# Patient Record
Sex: Male | Born: 1945 | Race: White | Hispanic: No | Marital: Married | Smoking: Never smoker
Health system: Southern US, Community
[De-identification: ages and names within clinical notes are randomized; demographics above are authoritative.]

## PROBLEM LIST (undated history)

## (undated) DIAGNOSIS — F418 Other specified anxiety disorders: Secondary | ICD-10-CM

## (undated) DIAGNOSIS — R0789 Other chest pain: Secondary | ICD-10-CM

## (undated) DIAGNOSIS — M47812 Spondylosis without myelopathy or radiculopathy, cervical region: Secondary | ICD-10-CM

## (undated) DIAGNOSIS — K5790 Diverticulosis of intestine, part unspecified, without perforation or abscess without bleeding: Secondary | ICD-10-CM

## (undated) DIAGNOSIS — Z5189 Encounter for other specified aftercare: Secondary | ICD-10-CM

## (undated) DIAGNOSIS — E669 Obesity, unspecified: Secondary | ICD-10-CM

## (undated) DIAGNOSIS — I2510 Atherosclerotic heart disease of native coronary artery without angina pectoris: Secondary | ICD-10-CM

## (undated) DIAGNOSIS — G4733 Obstructive sleep apnea (adult) (pediatric): Secondary | ICD-10-CM

## (undated) DIAGNOSIS — E049 Nontoxic goiter, unspecified: Secondary | ICD-10-CM

## (undated) DIAGNOSIS — K449 Diaphragmatic hernia without obstruction or gangrene: Secondary | ICD-10-CM

## (undated) DIAGNOSIS — C801 Malignant (primary) neoplasm, unspecified: Secondary | ICD-10-CM

## (undated) DIAGNOSIS — C4491 Basal cell carcinoma of skin, unspecified: Secondary | ICD-10-CM

## (undated) DIAGNOSIS — I712 Thoracic aortic aneurysm, without rupture, unspecified: Secondary | ICD-10-CM

## (undated) DIAGNOSIS — E785 Hyperlipidemia, unspecified: Secondary | ICD-10-CM

## (undated) DIAGNOSIS — J45909 Unspecified asthma, uncomplicated: Secondary | ICD-10-CM

## (undated) DIAGNOSIS — D126 Benign neoplasm of colon, unspecified: Secondary | ICD-10-CM

## (undated) DIAGNOSIS — G4730 Sleep apnea, unspecified: Secondary | ICD-10-CM

## (undated) DIAGNOSIS — I719 Aortic aneurysm of unspecified site, without rupture: Secondary | ICD-10-CM

## (undated) DIAGNOSIS — K589 Irritable bowel syndrome without diarrhea: Secondary | ICD-10-CM

## (undated) DIAGNOSIS — K760 Fatty (change of) liver, not elsewhere classified: Secondary | ICD-10-CM

## (undated) DIAGNOSIS — D696 Thrombocytopenia, unspecified: Secondary | ICD-10-CM

## (undated) DIAGNOSIS — I10 Essential (primary) hypertension: Secondary | ICD-10-CM

## (undated) HISTORY — DX: Thoracic aortic aneurysm, without rupture: I71.2

## (undated) HISTORY — DX: Obesity, unspecified: E66.9

## (undated) HISTORY — DX: Sleep apnea, unspecified: G47.30

## (undated) HISTORY — DX: Benign neoplasm of colon, unspecified: D12.6

## (undated) HISTORY — DX: Unspecified asthma, uncomplicated: J45.909

## (undated) HISTORY — DX: Basal cell carcinoma of skin, unspecified: C44.91

## (undated) HISTORY — PX: EXCISIONAL HEMORRHOIDECTOMY: SHX1541

## (undated) HISTORY — DX: Thrombocytopenia, unspecified: D69.6

## (undated) HISTORY — DX: Diverticulosis of intestine, part unspecified, without perforation or abscess without bleeding: K57.90

## (undated) HISTORY — DX: Spondylosis without myelopathy or radiculopathy, cervical region: M47.812

## (undated) HISTORY — DX: Hyperlipidemia, unspecified: E78.5

## (undated) HISTORY — DX: Nontoxic goiter, unspecified: E04.9

## (undated) HISTORY — PX: THYROIDECTOMY, PARTIAL: SHX18

## (undated) HISTORY — DX: Aortic aneurysm of unspecified site, without rupture: I71.9

## (undated) HISTORY — DX: Atherosclerotic heart disease of native coronary artery without angina pectoris: I25.10

## (undated) HISTORY — DX: Thoracic aortic aneurysm, without rupture, unspecified: I71.20

## (undated) HISTORY — PX: WISDOM TOOTH EXTRACTION: SHX21

## (undated) HISTORY — DX: Diaphragmatic hernia without obstruction or gangrene: K44.9

## (undated) HISTORY — DX: Encounter for other specified aftercare: Z51.89

## (undated) HISTORY — PX: OTHER SURGICAL HISTORY: SHX169

## (undated) HISTORY — PX: COLONOSCOPY: SHX174

## (undated) HISTORY — DX: Malignant (primary) neoplasm, unspecified: C80.1

## (undated) HISTORY — DX: Irritable bowel syndrome, unspecified: K58.9

## (undated) HISTORY — DX: Obstructive sleep apnea (adult) (pediatric): G47.33

## (undated) HISTORY — DX: Essential (primary) hypertension: I10

## (undated) HISTORY — DX: Other specified anxiety disorders: F41.8

## (undated) HISTORY — DX: Other chest pain: R07.89

## (undated) HISTORY — DX: Fatty (change of) liver, not elsewhere classified: K76.0

## (undated) HISTORY — PX: POLYPECTOMY: SHX149

---

## 1997-11-26 ENCOUNTER — Ambulatory Visit (HOSPITAL_COMMUNITY): Admission: RE | Admit: 1997-11-26 | Discharge: 1997-11-26 | Payer: Self-pay | Admitting: Gastroenterology

## 2003-04-29 ENCOUNTER — Emergency Department (HOSPITAL_COMMUNITY): Admission: EM | Admit: 2003-04-29 | Discharge: 2003-04-29 | Payer: Self-pay | Admitting: Family Medicine

## 2003-06-29 ENCOUNTER — Emergency Department (HOSPITAL_COMMUNITY): Admission: EM | Admit: 2003-06-29 | Discharge: 2003-06-29 | Payer: Self-pay | Admitting: Family Medicine

## 2004-05-28 ENCOUNTER — Emergency Department (HOSPITAL_COMMUNITY): Admission: EM | Admit: 2004-05-28 | Discharge: 2004-05-28 | Payer: Self-pay | Admitting: Emergency Medicine

## 2005-03-20 DIAGNOSIS — D126 Benign neoplasm of colon, unspecified: Secondary | ICD-10-CM

## 2005-03-20 HISTORY — DX: Benign neoplasm of colon, unspecified: D12.6

## 2005-03-23 ENCOUNTER — Ambulatory Visit: Payer: Self-pay | Admitting: Gastroenterology

## 2005-04-06 ENCOUNTER — Encounter (INDEPENDENT_AMBULATORY_CARE_PROVIDER_SITE_OTHER): Payer: Self-pay | Admitting: Specialist

## 2005-04-06 ENCOUNTER — Ambulatory Visit: Payer: Self-pay | Admitting: Gastroenterology

## 2006-05-22 ENCOUNTER — Ambulatory Visit (HOSPITAL_COMMUNITY): Admission: RE | Admit: 2006-05-22 | Discharge: 2006-05-22 | Payer: Self-pay | Admitting: *Deleted

## 2006-06-09 ENCOUNTER — Emergency Department (HOSPITAL_COMMUNITY): Admission: EM | Admit: 2006-06-09 | Discharge: 2006-06-09 | Payer: Self-pay | Admitting: Family Medicine

## 2007-03-17 ENCOUNTER — Ambulatory Visit: Payer: Self-pay | Admitting: *Deleted

## 2007-03-19 ENCOUNTER — Inpatient Hospital Stay (HOSPITAL_COMMUNITY): Admission: EM | Admit: 2007-03-19 | Discharge: 2007-03-20 | Payer: Self-pay | Admitting: Emergency Medicine

## 2007-03-26 ENCOUNTER — Encounter: Admission: RE | Admit: 2007-03-26 | Discharge: 2007-03-26 | Payer: Self-pay | Admitting: Cardiology

## 2008-11-13 IMAGING — CT CT ANGIO CHEST
3 of 5 series · 19 of 36 positions shown · IV contrast ([ID] OMNI 300)
Comparison: [HOSPITAL] portable chest x-ray, 03/17/07 at 7792 hours; [HOSPITAL] chest x-ray, 05/28/04.

CLINICAL DATA: Chest pain.
CT ANGIOGRAPHY OF CHEST WITHOUT AND WITH CONTRAST:
TECHNIQUE: Multidetector CT imaging of the chest was performed before and during bolus injection of intravenous contrast.  Multiplanar CT angiographic image reconstructions were generated to evaluate the vascular anatomy.
Contrast:  100 cc of Omnipaque 300.

[Series 5: angio · axial · 0.66mm/px · z∈[-330,-30]mm · 14 of 140 slices shown]
[im 10/140  lung]
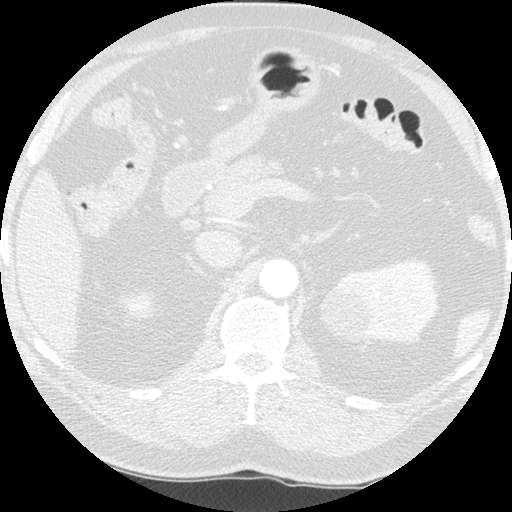
[im 19/140  mediastinal]
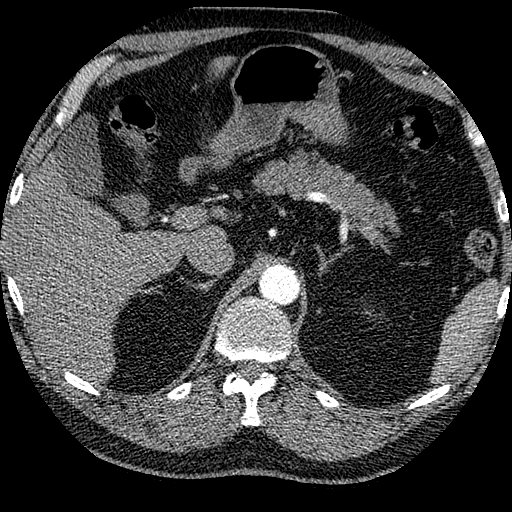
[im 28/140  lung]
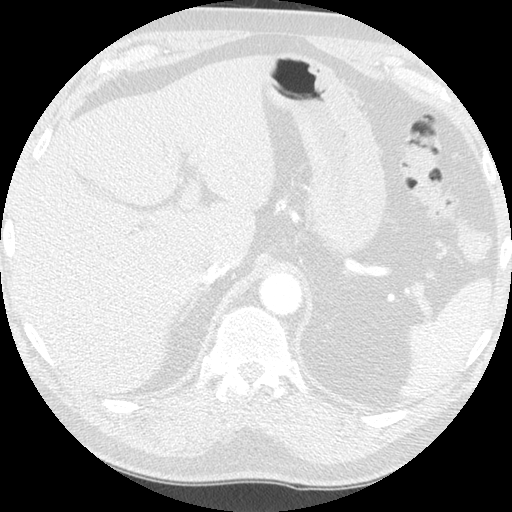
[im 38/140  mediastinal]
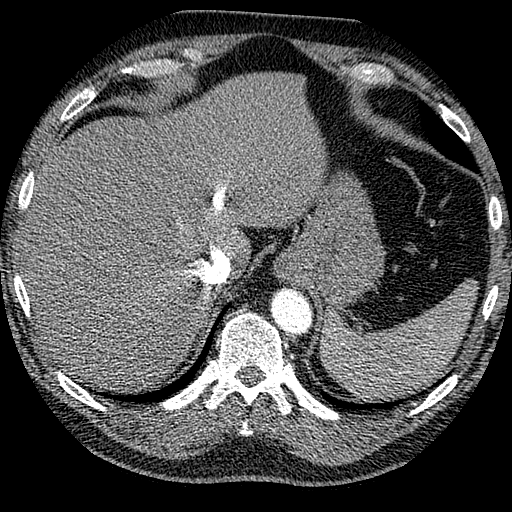
[im 47/140  lung]
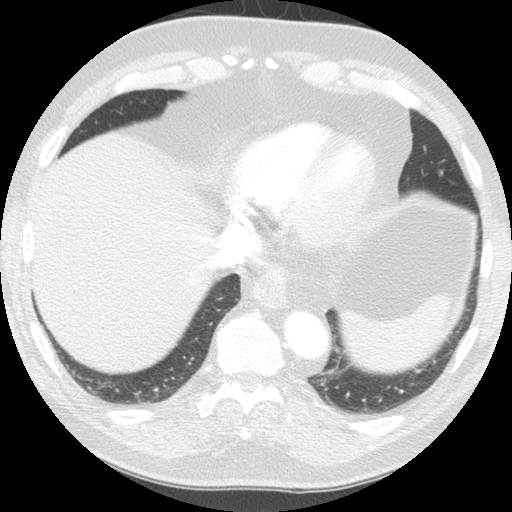
[im 56/140  mediastinal]
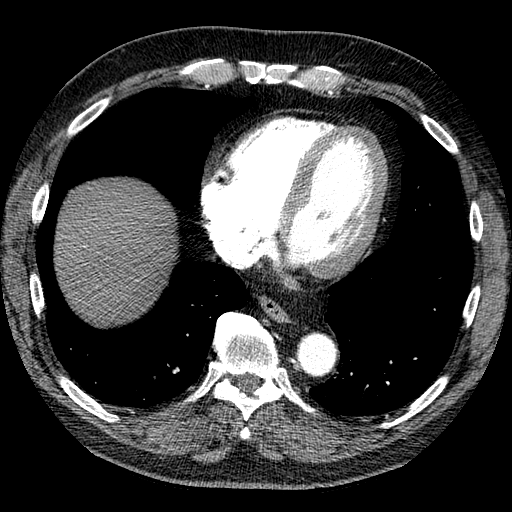
[im 65/140  lung]
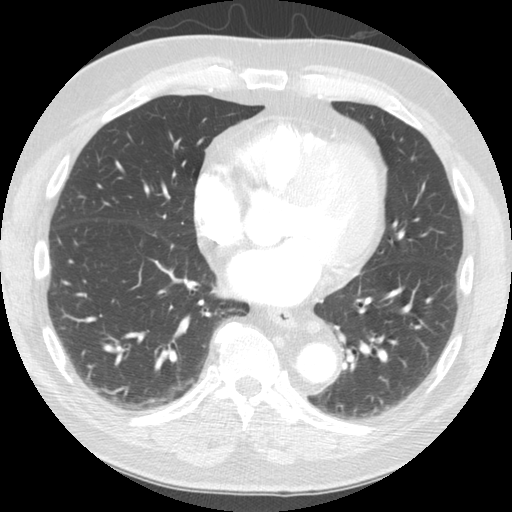
[im 75/140  mediastinal]
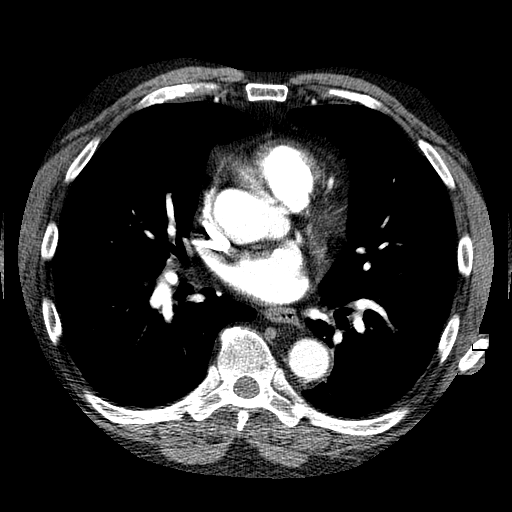
[im 84/140  lung]
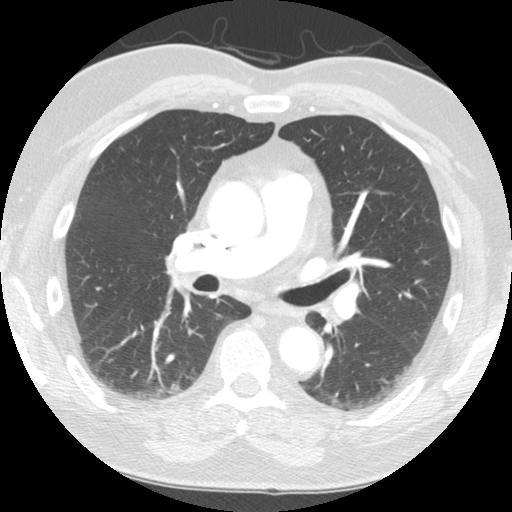
[im 93/140  mediastinal]
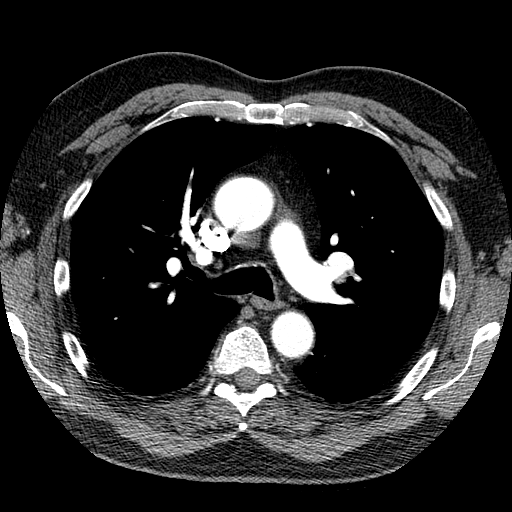
[im 102/140  lung]
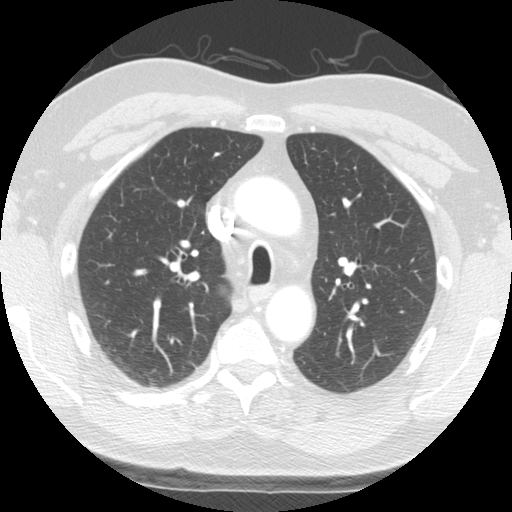
[im 112/140  mediastinal]
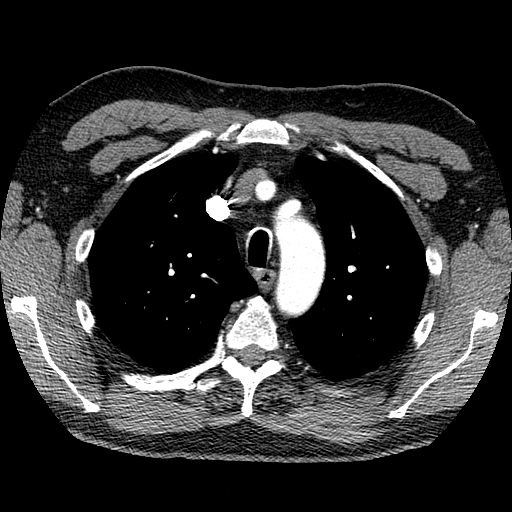
[im 121/140  lung]
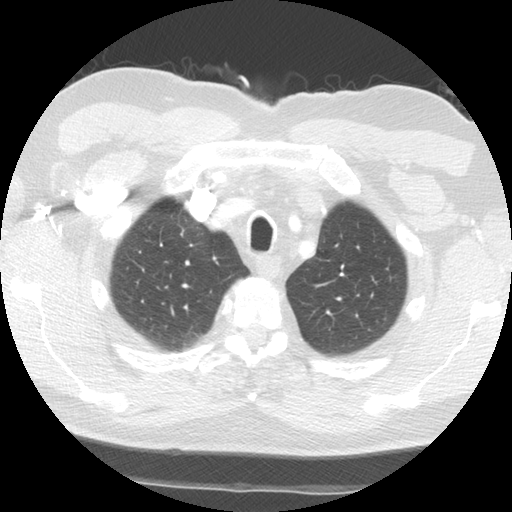
[im 130/140  mediastinal]
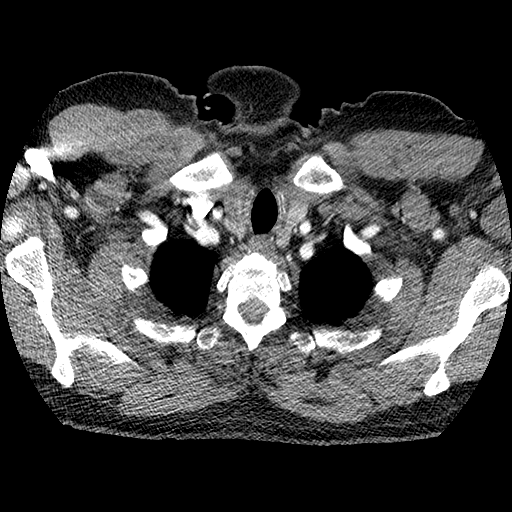

[Series 6: lung windows · axial · 0.62mm/px · z∈[-240,-195]mm · 2 of 57 slices shown]
[im 10/57  mediastinal]
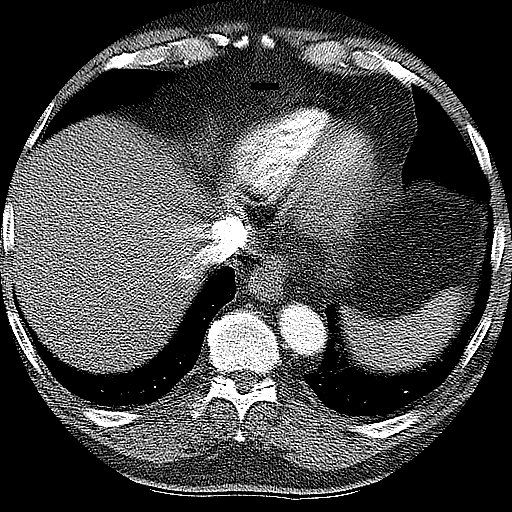
[im 19/57  mediastinal]
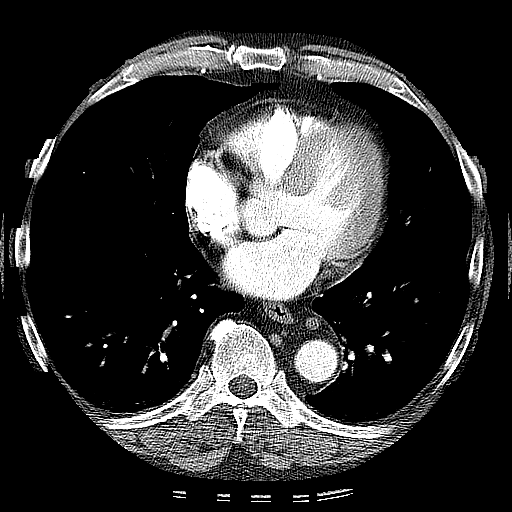

[Series 602: sagittal angio · sagittal · 0.68mm/px · 3 of 137 slices shown]
[im 28/137  mediastinal]
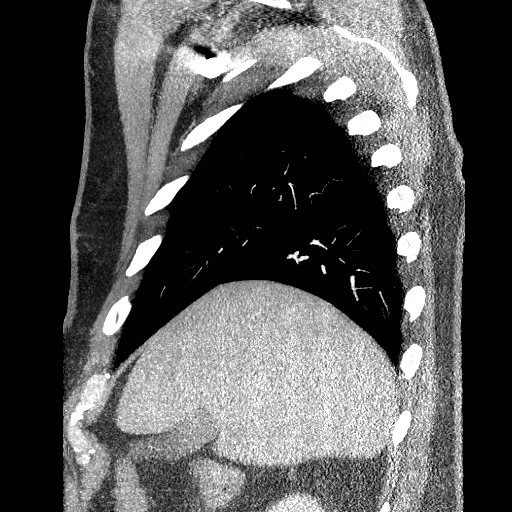
[im 55/137  mediastinal]
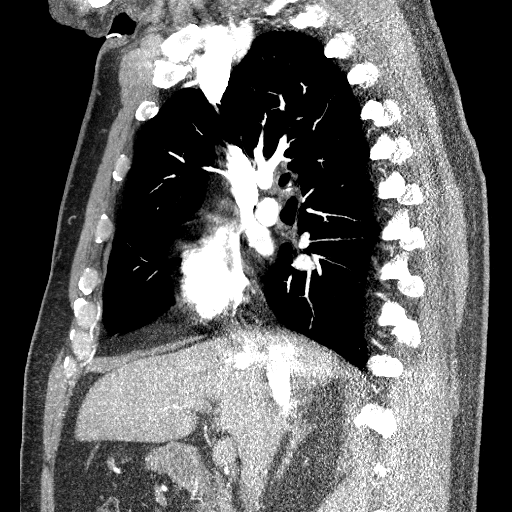
[im 82/137  mediastinal]
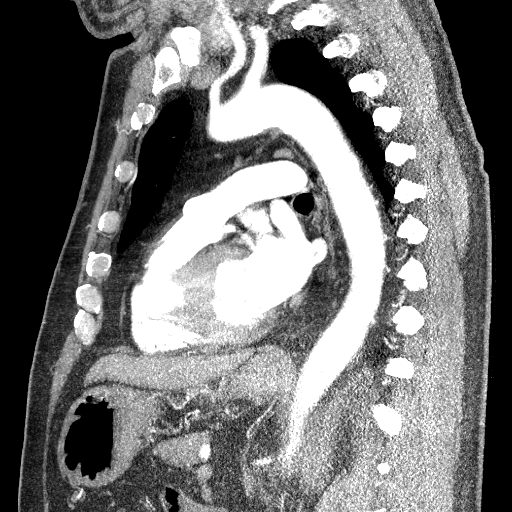

[19 of 36 positions shown; findings below may reference images not displayed]

FINDINGS: No acute thoracic aortic dissection nor thrombosis is seen with patent great arteries.  Ascending thoracic aorta measures up to 3.6 cm wide (axial image 58) with anterior arch 3.4 cm wide, posterior arch 3.2 cm wide, and descending thoracic aorta measuring 3.1 cm, all upper limits of normal.  No pulmonary embolus findings seen.  No mediastinal, hilar, nor axillary mass/adenopathy noted.  Heart size is normal yet prominent left ventricular hypertrophy is seen with wall measuring up to 1.4 cm wide (coronal image 59).  Lungs are clear.  No pericardial nor pleural effusion seen.  Slight diffuse fatty infiltration of the liver and 4 cm partially exophytic medial upper pole left renal cysts are seen.  Upper abdominal organs are otherwise normal appearing.  Degenerative changes are seen in the dorsal spine with calcified disk annulus seen at T12-L1 level.  No significant associated neural impingement is seen.
IMPRESSION: 1.  Thoracic aorta upper limits of normal in size with no dissection nor thrombosis.
2.  Left ventricular hypertrophy with wall measuring up to 1.4 cm in thickness. 
3.  No evidence for pulmonary embolus.
4.  Slight diffuse fatty infiltration of the liver, 4 cm upper pole left renal cyst, and degenerative changes of the lumbar spine, as described. 
5.  No acute findings.

## 2010-03-30 ENCOUNTER — Encounter: Payer: Self-pay | Admitting: Gastroenterology

## 2010-04-01 ENCOUNTER — Encounter (INDEPENDENT_AMBULATORY_CARE_PROVIDER_SITE_OTHER): Payer: Self-pay | Admitting: *Deleted

## 2010-04-21 ENCOUNTER — Encounter (INDEPENDENT_AMBULATORY_CARE_PROVIDER_SITE_OTHER): Payer: Self-pay | Admitting: *Deleted

## 2010-04-21 NOTE — Letter (Signed)
Summary: Colonoscopy Letter  [facility]  [address] [place], [place] [address]   Phone: [phone]  Fax: [phone]      March 30, 2010 MRN: [id number]   FARAH BENISH [address] [place], [place]  [address]   Dear Mr. DOWNARD,   According to your medical record, it is time for you to schedule a Colonoscopy. The American Cancer Society recommends this procedure as a method to detect early colon cancer. Patients with a family history of colon cancer, or a personal history of colon polyps or inflammatory bowel disease are at increased risk.  This letter has been generated based on the recommendations made at the time of your procedure. If you feel that in your particular situation this may no longer apply, please contact our office.  Please call our office at [phone] to schedule this appointment or to update your records at your earliest convenience.  Thank you for cooperating with [place] to provide you with the very best care possible.   Sincerely,  Judie Petit T. Russella Dar, M.D.  [facility] Gastroenterology Division [phone]

## 2010-04-21 NOTE — Letter (Signed)
Summary: Pre Visit Letter Revised  [facility]  [address] [place], [place] [address]   Phone: [phone]  Fax: [phone]        04/01/2010 MRN: [id number] Charles Underwood [address] [name], [place]  [address]                            Procedure Date:05/09/2010 @ 10:30                 Recall colon-Dr. Russella Dar Welcome to the Gastroenterology Division at [facility].    You are scheduled to see a nurse for your pre-procedure visit on 04/25/2010 at 2:30 on the 3rd floor at [facility], [address]  We ask that you try to arrive at our office 15 minutes prior to your appointment time to allow for check-in.  Please take a minute to review the attached form.  If you answer "Yes" to one or more of the questions on the first page, we ask that you call the person listed at your earliest opportunity.  If you answer "No" to all of the questions, please complete the rest of the form and bring it to your appointment.    Your nurse visit will consist of discussing your medical and surgical history, your immediate family medical history, and your medications.   If you are unable to list all of your medications on the form, please bring the medication bottles to your appointment and we will list them.  We will need to be aware of both prescribed and over the counter drugs.  We will need to know exact dosage information as well.    Please be prepared to read and sign documents such as consent forms, a financial agreement, and acknowledgement forms.  If necessary, and with your consent, a friend or relative is welcome to sit-in on the nurse visit with you.  Please bring your insurance card so that we may make a copy of it.  If your insurance requires a referral to see a specialist, please bring your referral form from your primary care physician.  No co-pay is required for this nurse visit.     If you cannot keep your appointment, please call [phone] to  cancel or reschedule prior to your appointment date.  This allows [place] the opportunity to schedule an appointment for another patient in need of care.    Thank you for choosing [facility] for your medical needs.  We appreciate the opportunity to care for you.  Please visit [place] at our website  to learn more about our practice.  Sincerely, The Gastroenterology Division

## 2010-05-02 ENCOUNTER — Encounter (INDEPENDENT_AMBULATORY_CARE_PROVIDER_SITE_OTHER): Payer: Self-pay | Admitting: *Deleted

## 2010-05-02 ENCOUNTER — Encounter: Payer: Self-pay | Admitting: Gastroenterology

## 2010-05-09 ENCOUNTER — Other Ambulatory Visit: Payer: Self-pay | Admitting: Gastroenterology

## 2010-05-09 ENCOUNTER — Other Ambulatory Visit (AMBULATORY_SURGERY_CENTER): Payer: Medicare Other | Admitting: Gastroenterology

## 2010-05-09 DIAGNOSIS — K5730 Diverticulosis of large intestine without perforation or abscess without bleeding: Secondary | ICD-10-CM

## 2010-05-09 DIAGNOSIS — Z86010 Personal history of colonic polyps: Secondary | ICD-10-CM

## 2010-05-09 DIAGNOSIS — Z1211 Encounter for screening for malignant neoplasm of colon: Secondary | ICD-10-CM

## 2010-05-09 DIAGNOSIS — D126 Benign neoplasm of colon, unspecified: Secondary | ICD-10-CM

## 2010-05-09 LAB — GLUCOSE, CAPILLARY: Glucose-Capillary: 128 mg/dL — ABNORMAL HIGH (ref 70–99)

## 2010-05-11 NOTE — Letter (Signed)
Summary: Moviprep Instructions  [facility]  [address]   [place], [place] [address]   Phone: [phone]  Fax: [phone]       Charles Underwood    1945/10/27    MRN: [id number]        Procedure Day Dorna Bloom: Monday, 05-09-10     Arrival Time: 9:30 a.m.     Procedure Time: 10:30 a.m.     Location of Procedure:                    x   [facility] (4th Floor)                        PREPARATION FOR COLONOSCOPY WITH MOVIPREP   Starting 5 days prior to your procedure 05-04-10 do not eat nuts, seeds, popcorn, corn, beans, peas,  salads, or any raw vegetables.  Do not take any fiber supplements (e.g. Metamucil, Citrucel, and Benefiber).  THE DAY BEFORE YOUR PROCEDURE         DATE: 05-08-10   DAY: Sunday  1.  Drink clear liquids the entire day-NO SOLID FOOD  2.  Do not drink anything colored red or purple.  Avoid juices with pulp.  No orange juice.  3.  Drink at least 64 oz. (8 glasses) of fluid/clear liquids during the day to prevent dehydration and help the prep work efficiently.  CLEAR LIQUIDS INCLUDE: Water Jello Ice Popsicles Tea (sugar ok, no milk/cream) Powdered fruit flavored drinks Coffee (sugar ok, no milk/cream) Gatorade Juice: apple, white grape, white cranberry  Lemonade Clear bullion, consomm, broth Carbonated beverages (any kind) Strained chicken noodle soup Hard Candy                             4.  In the morning, mix first dose of MoviPrep solution:    Empty 1 Pouch A and 1 Pouch B into the disposable container    Add lukewarm drinking water to the top line of the container. Mix to dissolve    Refrigerate (mixed solution should be used within 24 hrs)  5.  Begin drinking the prep at 5:00 p.m. The MoviPrep container is divided by 4 marks.   Every 15 minutes drink the solution down to the next mark (approximately 8 oz) until the full liter is complete.   6.  Follow completed prep with 16 oz of clear liquid of your choice  (Nothing red or purple).  Continue to drink clear liquids until bedtime.  7.  Before going to bed, mix second dose of MoviPrep solution:    Empty 1 Pouch A and 1 Pouch B into the disposable container    Add lukewarm drinking water to the top line of the container. Mix to dissolve    Refrigerate  THE DAY OF YOUR PROCEDURE      DATE: 05-09-10  DAY: Monday  Beginning at 5:30 a.m. (5 hours before procedure):         1. Every 15 minutes, drink the solution down to the next mark (approx 8 oz) until the full liter is complete.  2. Follow completed prep with 16 oz. of clear liquid of your choice.    3. You may drink clear liquids until 8:30 a.m.  (2 HOURS BEFORE PROCEDURE).   MEDICATION INSTRUCTIONS  Unless otherwise instructed, you should take regular prescription medications with a small sip of water   as early as possible  the morning of your procedure.  Diabetic patients - see separate instructions.   Additional medication instructions:  Hold Hydrochlorothiazide morning of procedure only!!!         OTHER INSTRUCTIONS  You will need a responsible adult at least 66 years of age to accompany you and drive you home.   This person must remain in the waiting room during your procedure.  Wear loose fitting clothing that is easily removed.  Leave jewelry and other valuables at home.  However, you may wish to bring a book to read or  an iPod/MP3 player to listen to music as you wait for your procedure to start.  Remove all body piercing jewelry and leave at home.  Total time from sign-in until discharge is approximately 2-3 hours.  You should go home directly after your procedure and rest.  You can resume normal activities the  day after your procedure.  The day of your procedure you should not:   Drive   Make legal decisions   Operate machinery   Drink alcohol   Return to work  You will receive specific instructions about eating, activities and medications before you  leave.    The above instructions have been reviewed and explained to me by   Sherren Kerns RN  May 02, 2010 2:21 PM     I fully understand and can verbalize these instructions _____________________________ Date _________

## 2010-05-11 NOTE — Letter (Signed)
Summary: Diabetic Instructions  [facility]  [address] [place], [place] [address]   Phone: [phone]  Fax: [phone]    Charles Underwood September 03, 1945 MRN: [id number]   _ x _   ORAL DIABETIC MEDICATION INSTRUCTIONS  The day before your procedure:   Take your diabetic pill as you do normally  The day of your procedure:   Do not take your diabetic pill    We will check your blood sugar levels during the admission process and again in Recovery before discharging you home  ________________________________________________________________________

## 2010-05-11 NOTE — Miscellaneous (Signed)
Summary: previsit prep/lec/rm  Clinical Lists Changes  Medications: Added new medication of MOVIPREP 100 GM  SOLR (PEG-KCL-NACL-NASULF-NA ASC-C) As per prep instructions. - Signed Rx of MOVIPREP 100 GM  SOLR (PEG-KCL-NACL-NASULF-NA ASC-C) As per prep instructions.;  #1 x 0;  Signed;  Entered by: Sherren Kerns RN;  Authorized by: Meryl Dare MD [facility];  Method used: [facility]  [id number] [address] [place], [place], [place]  [address], Ph: [phone], Fax: [phone] Observations: Added new observation of ALLERGY REV: Done (05/02/2010 14:25) Added new observation of NKA: T (05/02/2010 14:25)    Prescriptions: MOVIPREP 100 GM  SOLR (PEG-KCL-NACL-NASULF-NA ASC-C) As per prep instructions.  #1 x 0   Entered by:   Sherren Kerns RN   Authorized by:   Meryl Dare MD [facility]   Signed by:   Sherren Kerns RN on 05/02/2010   Method used:   [facility]        [facility]  [address] [id number] ([facility]ail)       [address]       [place], [place]  [address]       Ph: [phone]       Fax: [phone]   RxID:   [id number]

## 2010-05-12 ENCOUNTER — Encounter: Payer: Self-pay | Admitting: Gastroenterology

## 2010-05-17 NOTE — Procedures (Addendum)
Summary: Colonoscopy  Patient: Charles Underwood Note: All result statuses are Final unless otherwise noted.  Tests: (1) Colonoscopy (COL)   COL Colonoscopy           DONE (C)     [facility]     [address]     [place], [place]  [address]           COLONOSCOPY PROCEDURE REPORT           PATIENT:  [name], [name]  MR#:  [id number]     BIRTHDATE:  [birth year], 64 yrs. old  GENDER:  male     ENDOSCOPIST:  Judie Petit T. Russella Dar, MD, [facility]           PROCEDURE DATE:  05/09/2010     PROCEDURE:  Colonoscopy with snare polypectomy     ASA CLASS:  Class II     INDICATIONS:  1) surveillance and high-risk screening  2) history     of pre-cancerous (adenomatous) colon polyps: 03/2005.     MEDICATIONS:   Fentanyl 75 mcg IV, Versed 7 mg IV     DESCRIPTION OF PROCEDURE:   After the risks benefits and     alternatives of the procedure were thoroughly explained, informed     consent was obtained.  Digital rectal exam was performed and     revealed no abnormalities.   The LB [id number]-[id number] endoscope     was introduced through the anus and advanced to the cecum, which     was identified by both the appendix and ileocecal valve, without     limitations.  The quality of the prep was good, using MoviPrep.     The instrument was then slowly withdrawn as the colon was fully     examined.     <<PROCEDUREIMAGES>>     FINDINGS:  A sessile polyp was found in the distal transverse     colon. It was 5 mm in size. Polyp was snared without cautery.     Retrieval was successful. Moderate diverticulosis was found in the     sigmoid to descending colon.  Otherwise normal colonoscopy without     other polyps, masses, vascular ectasias, or inflammatory changes.     Retroflexed views in the rectum revealed internal hemorrhoids,     small.  The time to cecum =  3  minutes. The scope was then     withdrawn (time =  10.25  min) from the patient and the procedure     completed.           COMPLICATIONS:  None          ENDOSCOPIC IMPRESSION:     1) 5 mm sessile polyp in the distal transverse colon     2) Moderate diverticulosis in the sigmoid to descending colon     3) Internal hemorrhoids           RECOMMENDATIONS:     1) High fiber diet with liberal fluid intake.     2) Repeat Colonoscopy in 5 years pending pathology review.     3) Await pathology report           Judie Petit T. Russella Dar, MD, [facility]           n.     REVISED:  05/09/2010 11:20 AM     eSIGNED:   Venita Lick. Irena Gaydos at 05/09/2010 11:20 AM           Baker Janus, [id number]  Note: An exclamation  mark (!) indicates a result that was not dispersed into the flowsheet. Document Creation Date: 05/09/2010 11:21 AM _______________________________________________________________________  (1) Order result status: Final Collection or observation date-time: 05/09/2010 11:04 Requested date-time:  Receipt date-time:  Reported date-time:  Referring Physician:   Ordering Physician: Claudette Head [phone]) Specimen Source:  Source: Launa Grill Order Number: [id number] Lab site:   Appended Document: Colonoscopy     Procedures Next Due Date:    Colonoscopy: 05/2015

## 2010-05-17 NOTE — Letter (Addendum)
Summary: Patient Notice- Polyp Results  [facility]  [address] [place], [place] [address]   Phone: [phone]  Fax: [phone]        May 12, 2010 MRN: [id number]    Charles Underwood [address] [place], [place]  [address]    Dear Mr. CUTTING,  I am pleased to inform you that the colon polyp(s) removed during your recent colonoscopy was (were) found to be benign (no cancer detected) upon pathologic examination.  I recommend you have a repeat colonoscopy examination in 5 years to look for recurrent polyps, as having colon polyps increases your risk for having recurrent polyps or even colon cancer in the future.  Should you develop new or worsening symptoms of abdominal pain, bowel habit changes or bleeding from the rectum or bowels, please schedule an evaluation with either your primary care physician or with me.  Continue treatment plan as outlined the day of your exam.  Please call us if you are having persistent problems or have questions about your condition that have not been fully answered at this time.  Sincerely,  Meryl Dare MD [facility]  This letter has been electronically signed by your physician.  Appended Document: Patient Notice- Polyp Results letter mailed  Appended Document: Patient Notice- Polyp Results letter mailed

## 2010-08-02 NOTE — Consult Note (Signed)
Charles Underwood, HAILE NO.:  [id number]   MEDICAL RECORD NO.:  [id number]          PATIENT TYPE:  OBV   LOCATION:  3728                         FACILITY:  [facility]   PHYSICIAN:  Cassell Clement, M.D. DATE OF BIRTH:  February 19, 1946   DATE OF CONSULTATION:  03/18/2007  DATE OF DISCHARGE:                                 CONSULTATION   Cardiology Consultation   REFERRING PHYSICIAN:  Manning Charity, MD   HISTORY:  This is a 65 year old married Caucasian gentleman admitted on  March 17, 2007 after having suffered severe chest pain while taking a  shower on Sunday morning.  The chest pain was associated with profound  weakness, and a sense of fullness in the chest.  He also had a sensation  that his heart was pounding.  He gives a history that over the past  month or so, he has had increasing exertional fatigue, and has had some  intermittent chest pain with occasional left arm radiation.  The patient  does have multiple risk factors for coronary disease including a low HDL  level of 25.   Positive family history with the father having had coronary disease, but  dying of a ruptured aortic aneurysm, and a personal history of diabetes  and essential hypertension.  He has been getting most to his recent  medical care down at the [facility] in [place], but has been to [facility] in the past as well.   Since admission his cardiac enzymes have been normal and is resting  electrocardiograms have shown no acute changes.  His 2-dimensional  echocardiogram report is in the chart showing a low normal ejection  fraction of approximately 50% with mild diffuse hypokinesis, and a  slightly dilated aortic root at 41 mm.   SOCIAL HISTORY:  Reveals that he is married.  He does not use alcohol or  tobacco.   OUTPATIENT MEDICATIONS AT THE TIME OF ADMISSION INCLUDE:  1. Aspirin 81 mg daily.  2. Hydrochlorothiazide 25 mg daily.  3. Losartan 100 mg daily.  4. Omeprazole  20 mg daily.  5. Pravastatin 2 mg b.i.d.  6. Zocor 20 mg at bedtime.  7. Trazodone 200 mg at bedtime.  8. Metformin 500 mg b.i.d.  9. __________ 150 mg each a.m.  10.Citalopram 20 mg each bedtime.   PAST MEDICAL HISTORY:  Positive for depression and diabetes mellitus,  post traumatic stress syndrome hiatal hernia, diverticulosis,  hypertension.   REVIEW OF SYSTEMS:  Otherwise noncontributory.   PHYSICAL EXAM:  VITAL SIGNS:  Blood pressure tonight is 120/80 taken by  me in the left arm, pulse is 54 and is regular in normal sinus rhythm.  His color is good.  SKIN:  Warm and dry.  NECK:  Carotids are normal.  Jugular venous pressure normal.  Thyroid  normal.  CHEST:  Clear.  HEART:  Reveals a quiet precordium with normal first and second heart  sounds.  No murmur, gallop, rub or click.  ABDOMEN:  Soft and nontender.  No hepatosplenomegaly.  EXTREMITIES:  Show good peripheral pulses.  No phlebitis or edema.  NEUROLOGIC:  Exam is physiologic.  SKIN AND SENSORY:  Normal.   INITIAL LABS:  Satisfactory with a BUN of 16 and a creatinine of 1.1.  Potassium is 3.4, and potassium is being repleted.  Chest x-ray results  are pending and the radiology reading system is presently down computer-  wise.   IMPRESSION:  The patient presents a worrisome combination of chest pain,  unexplained fatigue, multiple risk factors for coronary disease and a  slightly low ejection fraction by echo.  With these risk factors would  proceed with cardiac catheterization.  We will schedule this for  tomorrow morning by Dr. Deborah Chalk or colleagues.           ______________________________  Cassell Clement, M.D.     TB/MEDQ  D:  03/18/2007  T:  03/19/2007  Job:  [id number]   cc:   Manning Charity, MD

## 2010-08-02 NOTE — Discharge Summary (Signed)
NAMEMACE, WEINBERG NO.:  [id number]   MEDICAL RECORD NO.:  [id number]          PATIENT TYPE:  OBV   LOCATION:  3728                         FACILITY:  [facility]   PHYSICIAN:  Joaquin Courts, MD     DATE OF BIRTH:  [birth year]   DATE OF ADMISSION:  03/17/2007  DATE OF DISCHARGE:  03/20/2007                               DISCHARGE SUMMARY   ATTENDING PHYSICIAN:  Dr. Madaline Guthrie.   DISCHARGE DIAGNOSES:  1. Chest pain.  2. Mild coronary artery disease.  3. Diabetes mellitus, type 2.  4. Elevated ALT.  5. Mild fatty infiltration of the liver.  6. Hypertension.  7. Hyperlipidemia.  8. Mild thrombocytopenia.  9. Obstructive sleep apnea.  10.Posttraumatic stress disorder.  11.Left renal cyst.   DISCHARGE MEDICATIONS:  1. Aspirin 81 mg daily.  2. Citalopram-D 50 mg q.h.s.  3. HCTZ 25 mg daily.  4. Losartan 100 mg daily.  5. Omeprazole 20 mg daily.  6. Prazosin 4 mg b.i.d.  7. Simvastatin 20 mg q.h.s.  8. Trazodone 200 mg q.h.s.  9. Metformin 500 mg b.i.d. with the first dose on January 3rd.   DISPOSITION AND FOLLOWUP:  The patient is to have a CT angio of the  aorta done in a week after discharge and Dr. Yevonne Pax office will  give him a call with that appointment.  The patient is also to follow up  with his primary care physician in two weeks.  At that time, a CBC needs  to be drawn to evaluate for his thrombocytopenia, and LFTs need to be  done to evaluate for transaminitis.   PROCEDURES PERFORMED:  A cardiac catheterization was performed during  this hospitalization by Dr. Roger Shelter.  The patient had a normal  left ventricular function.  Some ectasia of the descending aorta with no  aortic insufficiency was noted, and the patient also had mild, diffuse,  coronary atherosclerosis.  The left main coronary artery was completely  normal.  The left anterior descending had some irregularities, all less  than 30%.  The left circumflex had minor  irregularities.  The right  coronary was dominant and only minor irregularities scattered  throughout.   CONSULTATIONS:  Cardiology was consulted secondary to patient's history  of chest pain and fatigue given his history of hypertension and  hyperlipidemia and diabetes.   ADMITTING HISTORY AND PHYSICAL:  Patient is a 65 year old gentleman  presenting with a one month history of increasing fatigue with  occasional left-sided chest pain and occasional left arm pain.  The  morning of admission, he developed substernal chest discomfort that was  described as pressure-like and associated with mild nausea.  At the time  of arrival to the emergency department, his pain had resolved.  He  denied any diaphoresis or shortness of breath, nausea, and vomiting.   ADMISSION VITAL SIGNS:  Temperature 98.3, blood pressure 137/82, pulse  67, respiratory rate 16, O2 SAT 95% on room air.   PHYSICAL EXAMINATION:  GENERAL:  The patient is alert and pleasant and  in no distress.  NECK EXAM:  No lymphadenopathy and no thyromegaly.  RESPIRATORY EXAM:  Clear to auscultation bilaterally, no crackles, with  a normal respiratory effort.  CARDIOVASCULAR EXAM:  Regular rate and rhythm, heart sounds were  distant, but no murmurs, rubs, or gallops.  ABDOMINAL EXAM:  Positive bowel sounds, soft, nontender, and  nondistended.  EXTREMITY EXAM:  No edema, 2+ dorsalis pedis pulses bilaterally.  SKIN EXAM:  No rashes, some small arterial hemangiomas were noted on his  back.  NEURO EXAM:  Cranial nerves II-XII were intact, no cerebellar signs.  PSYCH EXAM:  His mood and affect were appropriate.   ADMISSION LABORATORY DATA:  Sodium 137, potassium 3.6, chloride 103,  bicarb 25, BUN 16, creatinine 1.2, glucose 220.  BNP less than 30.  Troponin 0.11.  White blood cell count 4.0, absolute neutrophil count  2.2, hemoglobin 15.0, MCV 90, and platelets 123.  Liver enzymes:  Bili  0.8, alkaline-phosphatase 55, AST 38, ALT  73, total protein 6.5, albumin  3.6, and calcium 8.8.  His myoglobin was normal at 93.6 and CK-MB less  than 1.   DIAGNOSTIC IMAGING:  Chest x-ray:  No acute disease.  Abdominal  ultrasound:  1)  Mild fatty infiltration of the liver; 2)  Slightly  ectatic abdominal aorta without overt aneurysm; 3)  Stable left kidney  upper pole renal cyst.   HOSPITAL COURSE:  1. Chest pain:  The patient's chest pain did not recur during this      hospitalization.  Cardiology was consulted and a workup was done      and it was not felt that the patient's chest pain was cardiac in      etiology.  A TSH was checked and was 0.607 and within normal      limits, and a free T4 was 1.25, within normal limits.  Cardiac      enzymes were cycled and his first troponin was mildly elevated at      0.11, and all repeat cardiac enzymes were within normal limits.      Serial EKGs were done and were all within normal limits.  An echo      was done and the patient's EF was estimated to 50%.  However, on      the cardiac catheterization report, his EF was at 60%.  Some mild,      diffuse, left ventricular hypokinesis was noted on the echo.  The      aortic valve thickness was mildly increased, and there was mild      aortic root dilatation along with left atrium mildly dilated.  2. Mild coronary artery disease:  The patient underwent cardiac      catheterization and very mild coronary artery disease was found,      and it was felt like medical management would be appropriate in      this patient with blood pressure control and statin therapy, along      with aspirin.  3. Diabetes mellitus, type 2:  The patient's Metformin was      discontinued while inpatient and he was started on sliding scale      insulin and his blood sugars were in the 100 to 200 range during      this hospitalization.  The patient is to restart his Metformin      three days after his cardiac catheterization.  A hemoglobin A1c was      checked  during this hospitalization and was elevated at 8.0.  4. Transaminitis:  The patient's transaminases were noted to be  mildly      elevated during this hospitalization.  His Nefazodone was stopped      and his liver enzymes trended down.  The patient was started on a      statin and will need his liver enzymes followed up as an      outpatient.  It was felt that he could continue his statin therapy      as long as his liver enzymes were monitored.  5. Mild fatty infiltration of the liver:  The patient had an abdominal      ultrasound done that indicated some mild fatty liver disease.  6. Hypertension:  The patient's blood pressure medications were      changed and a beta blocker was added, and he tolerated Metoprolol      well during this hospitalization.  However, when he did not have      any significant coronary artery disease, the patient was restarted      on his home medications.  7. Hyperlipidemia:  The patient noted that he had been off his statin      as an outpatient.  A lipid panel was checked during this      hospitalization and his total cholesterol was 160 with an LDL at      106.  Given his diabetes, his goal LDL was less than 70 and he was      restarted on a statin.  8. Obstructive sleep apnea:  The patient was continued on CPAP      throughout this hospitalization.  9. PTSD:  The patient was continued on his Citalopram, and Ativan was      given p.r.n. for agitation.  He did very well and did not have any      troubles during this hospitalization.  His Nefazodone was      discontinued secondary to his elevated liver enzymes.   DISCHARGE VITAL SIGNS:  Temperature 97.9, blood pressure 121/82, pulse  55, respiratory rate 18, O2 SAT 96%.   DISCHARGE LABORATORY DATA:  Sodium 135, potassium 3.7, chloride 105,  bicarb 26, BUN 15, creatinine 1.0, glucose 130, calcium 8.4.  White  blood cell count 5.0, hemoglobin 14.3, MCV 91.3, platelet count 140.  His acute hepatitis panel  was all within normal limits.      Joaquin Courts, MD  Electronically Signed     VW/MEDQ  D:  03/20/2007  T:  03/20/2007  Job:  [id number]

## 2010-08-02 NOTE — Cardiovascular Report (Signed)
NAMETKAI, SERFASS              ACCOUNT NO.:  [id number]   MEDICAL RECORD NO.:  [id number]          PATIENT TYPE:  OBV   LOCATION:  3728                         FACILITY:  [facility]   PHYSICIAN:  Colleen Can. Deborah Chalk, M.D.DATE OF BIRTH:  January 20, 1946   DATE OF PROCEDURE:  03/19/2007  DATE OF DISCHARGE:                            CARDIAC CATHETERIZATION   HISTORY:  Mr. Tankard is a 65 year old male who presents with increasing  fatigue and occasional left-sided chest discomfort and left arm pain.  He described a pressure-like sensation when he was showering.  There are  some atypical features of the discomfort.  He does have a positive  family history and is referred now for evaluation.   PROCEDURE:  Left heart catheterization with selective coronary  angiography, left ventricular angiography.   TYPE AND SITE OF ENTRY:  Percutaneous right femoral artery with  AngioSeal.   CATHETERS:  The 6-French 5 curved Judkins left coronary catheters, 6-  French 4 curved Judkins right coronary catheter, 6-French pigtail  ventriculographic catheter.   CONTRAST MATERIAL:  Omnipaque.   MEDICATIONS GIVEN PRIOR TO PROCEDURE:  Valium 10 mg.   MEDICATIONS GIVEN DURING THE PROCEDURE:  Versed 2 mg IV.   COMMENT:  The patient tolerated the procedure well.   HEMODYNAMIC DATA:  The aortic pressure was 105/70, LV was 123/3-19.  There was no aortic valve gradient noted on pullback.   ANGIOGRAPHIC DATA:  1. Left main coronary artery is normal.  2. Left anterior descending was only a moderate size vessel that      extended to the apex, but the apex was supplied by large marginal      vessels.  There were irregularities of less than 30% scattered      throughout the left anterior descending but there was definite      irregularities in the left anterior descending itself.  3. Left circumflex.  The left circumflex continues as a large      bifurcating marginal vessel.  It has minor irregularities but is  essentially normal.  4. Right coronary artery.  The right coronary artery is a large      dominant vessel.  There are minor irregularities scattered.  It has      a large posterior descending and a very large posterior lateral      branch.  There are no obstructive lesions.   Left ventricular angiogram is performed in RAO position.  The overall  cardiac size and silhouette are normal.  The global ejection fraction is  estimated at 60%.  Regional wall motion is normal.   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  This ectasia is somewhat diffuse  throughout the thoracic aorta is not focal or localizing.   OVERALL IMPRESSION:  1. Normal left ventricular function.  2. 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  3. Mild diffuse coronary atherosclerosis.   DISCUSSION:  It is felt that Mr. Forero could be managed medically at  this point in time.  Overall, his prognosis should be excellent.  Colleen Can. Deborah Chalk, M.D.  Electronically Signed     SNT/MEDQ  D:  03/19/2007  T:  03/20/2007  Job:  [id number]   cc:   Cassell Clement, M.D.  Internal Med. Teaching Service B  [employer] [employer]  [employer]

## 2010-08-09 ENCOUNTER — Other Ambulatory Visit: Payer: Self-pay | Admitting: Dermatology

## 2010-12-23 LAB — DIFFERENTIAL
Basophils Absolute: 0
Basophils Absolute: 0
Basophils Relative: 0
Basophils Relative: 1
Eosinophils Absolute: 0.2
Eosinophils Absolute: 0.3
Eosinophils Relative: 5
Lymphocytes Relative: 33
Lymphs Abs: 1.8
Monocytes Absolute: 0.5
Monocytes Relative: 9
Neutro Abs: 2.9
Neutrophils Relative %: 53

## 2010-12-23 LAB — CBC
HCT: 42.3
HCT: 42.4
HCT: 42.6
HCT: 43.4
Hemoglobin: 14.4
Hemoglobin: 14.7
Hemoglobin: 14.8
MCHC: 33.8
MCHC: 33.9
MCHC: 34.1
MCHC: 34.5
MCV: 89.4
MCV: 90.3
MCV: 90.3
MCV: 91.3
Platelets: 123 — ABNORMAL LOW
Platelets: 128 — ABNORMAL LOW
Platelets: 131 — ABNORMAL LOW
Platelets: 140 — ABNORMAL LOW
RBC: 4.68
RBC: 4.77
RDW: 12.6
RDW: 12.6
RDW: 12.6
WBC: 3.9 — ABNORMAL LOW
WBC: 5.5
WBC: 5.6

## 2010-12-23 LAB — HEPATIC FUNCTION PANEL
ALT: 71 — ABNORMAL HIGH
AST: 39 — ABNORMAL HIGH
Albumin: 3.5

## 2010-12-23 LAB — COMPREHENSIVE METABOLIC PANEL
ALT: 58 — ABNORMAL HIGH
ALT: 73 — ABNORMAL HIGH
AST: 35
AST: 38 — ABNORMAL HIGH
Albumin: 3.3 — ABNORMAL LOW
Albumin: 3.6
Albumin: 3.6
Alkaline Phosphatase: 49
Alkaline Phosphatase: 55
BUN: 13
BUN: 15
BUN: 16
CO2: 26
CO2: 30
Calcium: 8.7
Calcium: 8.8
Chloride: 100
Chloride: 102
Creatinine, Ser: 1.06
Creatinine, Ser: 1.11
Creatinine, Ser: 1.16
GFR calc Af Amer: >60
GFR calc Af Amer: >60
GFR calc non Af Amer: >60
GFR calc non Af Amer: >60
Glucose, Bld: 121 — ABNORMAL HIGH
Glucose, Bld: 96
Potassium: 3.4 — ABNORMAL LOW
Potassium: 4.5
Sodium: 133 — ABNORMAL LOW
Sodium: 135
Total Bilirubin: 0.7
Total Bilirubin: 0.8
Total Bilirubin: 0.9
Total Protein: 5.8 — ABNORMAL LOW
Total Protein: 6.2
Total Protein: 6.5

## 2010-12-23 LAB — CK TOTAL AND CKMB (NOT AT ARMC)
CK, MB: 2.6
Relative Index: 1.8
Total CK: 142

## 2010-12-23 LAB — I-STAT 8, (EC8 V) (CONVERTED LAB)
BUN: 16
Bicarbonate: 25.2 — ABNORMAL HIGH
Chloride: 103
Glucose, Bld: 220 — ABNORMAL HIGH
Potassium: 3.6
pH, Ven: 7.423 — ABNORMAL HIGH

## 2010-12-23 LAB — CARDIAC PANEL(CRET KIN+CKTOT+MB+TROPI)
CK, MB: 2
Relative Index: 2
Total CK: 102
Troponin I: 0.03
Troponin I: 0.03

## 2010-12-23 LAB — BASIC METABOLIC PANEL
BUN: 15
CO2: 26
Chloride: 105
Creatinine, Ser: 1
Potassium: 3.7

## 2010-12-23 LAB — POCT I-STAT CREATININE: Operator id: 285491

## 2010-12-23 LAB — LIPID PANEL
LDL Cholesterol: 106 — ABNORMAL HIGH
VLDL: 29

## 2010-12-23 LAB — URINALYSIS, ROUTINE W REFLEX MICROSCOPIC
Bilirubin Urine: NEGATIVE
Glucose, UA: 100 — AB
Ketones, ur: NEGATIVE
Nitrite: NEGATIVE
Protein, ur: NEGATIVE
Specific Gravity, Urine: 1.022
pH: 6

## 2010-12-23 LAB — HEPATITIS PANEL, ACUTE
HCV Ab: NEGATIVE
Hep A IgM: NEGATIVE
Hep B C IgM: NEGATIVE
Hepatitis B Surface Ag: NEGATIVE

## 2010-12-23 LAB — POCT CARDIAC MARKERS
CKMB, poc: 1.2
CKMB, poc: 1.3
Myoglobin, poc: 49.9
Myoglobin, poc: 83.7
Operator id: 295021
Troponin i, poc: 0.11 — ABNORMAL HIGH
Troponin i, poc: <0.05
Troponin i, poc: <0.05

## 2010-12-23 LAB — PROTIME-INR: Prothrombin Time: 12.9

## 2010-12-23 LAB — TSH: TSH: 0.607

## 2010-12-23 LAB — T4, FREE: Free T4: 1.25

## 2010-12-23 LAB — APTT: aPTT: 27

## 2010-12-23 LAB — TROPONIN I: Troponin I: 0.03

## 2010-12-28 ENCOUNTER — Other Ambulatory Visit: Payer: Self-pay | Admitting: Family Medicine

## 2010-12-28 ENCOUNTER — Ambulatory Visit
Admission: RE | Admit: 2010-12-28 | Discharge: 2010-12-28 | Disposition: A | Discharge status: Home / Self Care | Payer: Medicare Other | Source: Ambulatory Visit | Attending: Family Medicine | Admitting: Family Medicine

## 2010-12-28 MED ORDER — IOHEXOL 350 MG/ML SOLN: 125.0000 mL | Freq: Once | INTRAVENOUS | Status: AC | PRN

## 2011-01-16 ENCOUNTER — Telehealth: Payer: Self-pay | Admitting: Gastroenterology

## 2011-01-16 NOTE — Telephone Encounter (Signed)
Patient wants to discuss Dr Russella Dar having an EGD.  He is having chest pain.  His primary care MD has prescribed dexilant and had him stop omeprazole.  Neither seem to help.  I have scheduled an office visit for him for 01/25/11 2:45

## 2011-01-18 ENCOUNTER — Institutional Professional Consult (permissible substitution): Payer: Medicare Other | Admitting: Cardiovascular Disease

## 2011-01-23 ENCOUNTER — Encounter: Payer: Self-pay | Admitting: *Deleted

## 2011-01-24 ENCOUNTER — Ambulatory Visit (INDEPENDENT_AMBULATORY_CARE_PROVIDER_SITE_OTHER): Payer: Medicare Other | Admitting: Cardiovascular Disease

## 2011-01-24 VITALS — BP 118/84 | HR 75 | Ht 75.0 in | Wt 247.4 lb

## 2011-01-24 DIAGNOSIS — I10 Essential (primary) hypertension: Secondary | ICD-10-CM

## 2011-01-24 DIAGNOSIS — I2510 Atherosclerotic heart disease of native coronary artery without angina pectoris: Secondary | ICD-10-CM

## 2011-01-24 NOTE — Patient Instructions (Signed)
Your physician recommends that you schedule a follow-up appointment in: 4 WEEKS  Your physician recommends that you continue on your current medications as directed. Please refer to the Current Medication list given to you today.  Your physician has requested that you have en exercise stress myoview. For further information please visit [email or web address] Please follow instruction sheet, as given.

## 2011-01-25 ENCOUNTER — Encounter: Payer: Self-pay | Admitting: Gastroenterology

## 2011-01-25 ENCOUNTER — Ambulatory Visit (INDEPENDENT_AMBULATORY_CARE_PROVIDER_SITE_OTHER): Payer: Medicare Other | Admitting: Gastroenterology

## 2011-01-25 DIAGNOSIS — Z860100 Personal history of colon polyps, unspecified: Secondary | ICD-10-CM | POA: Insufficient documentation

## 2011-01-25 DIAGNOSIS — R079 Chest pain, unspecified: Secondary | ICD-10-CM

## 2011-01-25 DIAGNOSIS — K219 Gastro-esophageal reflux disease without esophagitis: Secondary | ICD-10-CM | POA: Insufficient documentation

## 2011-01-25 NOTE — Progress Notes (Signed)
History of Present Illness: This is a 65 year old male here today with his wife. He relates a several week history of mid sternal chest pain radiating directly through to his back. This pain was exacerbated by movement. He did not have any typical reflux symptoms and the symptoms did not change with meals. The pain is gradually subsiding. He underwent a chest CT angiogram which was unremarkable and he was evaluated by Dr. Excell Seltzer. A Myoview test is planned. He relates one episode of a spasm-like rectal pain that occurred in the middle of night and then resolved spontaneously. He underwent colonoscopy in February 2012. Denies weight loss, abdominal pain, constipation, diarrhea, change in stool caliber, melena, hematochezia, nausea, vomiting, dysphagia, reflux symptoms.  Current Medications, Allergies, Past Medical History, Past Surgical History, Family History and Social History were reviewed in [employer] record.  Physical Exam: General: Well developed , well nourished, no acute distress Head: Normocephalic and atraumatic Eyes:  sclerae anicteric, EOMI Ears: Normal auditory acuity Mouth: No deformity or lesions Lungs: Clear throughout to auscultation Heart: Regular rate and rhythm; no murmurs, rubs or bruits Abdomen: Soft, non tender and non distended. No masses, hepatosplenomegaly or hernias noted. Normal Bowel sounds Musculoskeletal: Symmetrical with no gross deformities  Pulses:  Normal pulses noted Extremities: No clubbing, cyanosis, edema or deformities noted Neurological: Alert oriented x 4, grossly nonfocal Psychological:  Alert and cooperative. Normal mood and affect  Assessment and Recommendations:  1. Chest pain. This does not appear to be GERD or GI related. Further cardiac workup is pending. If his chest pain returns consider proceeding with upper endoscopy and abdominal ultrasound to further evaluate. He declines further GI evaluation at this time.  2. GERD.  Continue omeprazole 40 mg daily and standard antireflux measures.  3. Personal history of adenomatous colon polyps. Surveillance colonoscopy recommended February 2017.  4. Self-limited rectal pain. 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

## 2011-01-25 NOTE — Patient Instructions (Addendum)
Take your omeprazole two tablets by mouth once daily at the same time every day. Patient advised to avoid spicy, acidic, citrus, chocolate, mints, fruit and fruit juices.  Limit the intake of caffeine, alcohol and Soda.  Don't exercise too soon after eating.  Don't lie down within 3-4 hours of eating.  Elevate the head of your bed. cc: Leo Grosser, MD

## 2011-01-27 ENCOUNTER — Encounter: Payer: Self-pay | Admitting: Gastroenterology

## 2011-01-31 ENCOUNTER — Encounter: Payer: Self-pay | Admitting: Cardiovascular Disease

## 2011-01-31 DIAGNOSIS — I2510 Atherosclerotic heart disease of native coronary artery without angina pectoris: Secondary | ICD-10-CM | POA: Insufficient documentation

## 2011-01-31 DIAGNOSIS — I10 Essential (primary) hypertension: Secondary | ICD-10-CM | POA: Insufficient documentation

## 2011-01-31 NOTE — Assessment & Plan Note (Signed)
BP well-controlled on current medical therapy.

## 2011-01-31 NOTE — Assessment & Plan Note (Signed)
The patient has atypical angina. However, with diabetes, strong family hx CAD, and 3 vessel coronary calcification he clearly warrants risk stratification with an exercise Myoview stress test. If any ischemia I would recommend cardiac catheterization for definitive diagnosis. I will arrange follow-up after the patient's stress test is completed. He is on appropriate medical therapy with ASA and a statin drug. 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

## 2011-01-31 NOTE — Progress Notes (Signed)
HPI:  This is a 65 year-old gentleman presenting for initial evaluation of chest pain and CAD. He has a background risk profile that includes Type 2 diabetes, hypertension, hyperlipidemia, and family history of CAD (father had CABG at 91). The patient had a recent CT of the chest (noncardiac CT) showing 3 vessel coronary calcification. 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   The patient has had dull left-sided chest pain, fleeting in nature. He reports the pain also as 'sharp' at times and it has radiated through to the back. Generally symptoms last only a matter of seconds to a few minutes. He has not had exertional chest pain or pressure with activities such as lawn-mowing and yardwork. He complains of generalized fatigue, but has no other specific complaints. He denies dyspnea, edema, palpitations, lightheadedness, or syncope.  He sees Dr Tanya Nones for general medical care and also is followed closely at the [facility].  Outpatient Encounter Prescriptions as of 01/24/2011  Medication Sig Dispense Refill  . aspirin 81 MG tablet Take 81 mg by mouth daily.        . beta carotene w/minerals (OCUVITE) tablet Take 1 tablet by mouth daily.        . hydrochlorothiazide (HYDRODIURIL) 25 MG tablet Take 25 mg by mouth daily.        Marland Kitchen losartan (COZAAR) 100 MG tablet Take 100 mg by mouth daily.        . metFORMIN (GLUCOPHAGE) 850 MG tablet Take 850 mg by mouth 2 (two) times daily with a meal.        . NON FORMULARY Eye drops each eye four times a day as needed       . omeprazole (PRILOSEC) 20 MG capsule Take 20 mg by mouth daily. Two capsules daily       . pravastatin (PRAVACHOL) 80 MG tablet Take 80 mg by mouth daily.        . prazosin (MINIPRESS) 2 MG capsule Take 2 mg by mouth. Two capsules by mouth twice a day      . sodium chloride (OCEAN) 0.65 % nasal spray Place 1 spray into the nose as needed. Twice daily       . traZODone (DESYREL) 50 MG tablet Take 50 mg  by mouth at bedtime. 3 tablets daily       . DISCONTD: ascorbic acid (VITAMIN C) 1000 MG tablet Take 1,000 mg by mouth daily.        Marland Kitchen DISCONTD: cholecalciferol (VITAMIN D) 400 UNITS TABS Take 1,000 Units by mouth daily. 2 tablets       . DISCONTD: fish oil-omega-3 fatty acids 1000 MG capsule Take 2 g by mouth daily.        Marland Kitchen DISCONTD: flunisolide (NASALIDE) 0.025 % SOLN Inhale 2 sprays into the lungs 2 (two) times daily.         Review of patient's allergies indicates no known allergies.  Past Medical History  Diagnosis Date  . Atypical chest pain   . Obesity   . Atherosclerosis 12/28/2010    on CTA of chest   . Hiatal hernia   . IBS (irritable bowel syndrome)   . Diverticulosis   . Hypertension   . Hemorrhoids   . Adenomatous polyp of colon 03/2005  . Hyperlipidemia   . Fatty infiltration of liver   . Thrombocytopenia   . OSA (obstructive sleep apnea)   . Diabetes mellitus   . Depression with anxiety   .  CAD (coronary artery disease)     No past surgical history on file.  History   Social History  . Marital Status: Married    Spouse Name: N/A    Number of Children: 4  . Years of Education: N/A   Occupational History  . Retired     Hotel manager    Social History Main Topics  . Smoking status: Never Smoker   . Smokeless tobacco: Never Used  . Alcohol Use: No  . Drug Use: No  . Sexually Active: Not on file   Other Topics Concern  . Not on file   Social History Narrative   0 caffeine drinks daily     Family History  Problem Relation Age of Onset  . Colon cancer Maternal Grandfather   . Colon polyps Maternal Grandfather     ROS: General: no fevers/chills/night sweats, positive for generalized fatigue Eyes: no blurry vision, diplopia, or amaurosis ENT: no sore throat or hearing loss, positive for seasonal allergies Resp: no cough, wheezing, or hemoptysis CV: no edema or palpitations GI: no abdominal pain, nausea, vomiting, diarrhea, or constipation,  positive for dyspepsia GU: no dysuria, frequency, or hematuria Skin: no rash Neuro: no headache, numbness, tingling, or weakness of extremities, positive for anxiety and depression Musculoskeletal: no joint pain or swelling Heme: no bleeding, DVT, or easy bruising Endo: no polydipsia or polyuria  BP 118/84  Pulse 75  Ht 6\' 3"  (1.905 m)  Wt 112.22 kg (247 lb 6.4 oz)  BMI 30.92 kg/m2  PHYSICAL EXAM: Pt is alert and oriented, WD, WN, in no distress. HEENT: normal Neck: JVP normal. Carotid upstrokes normal without bruits. No thyromegaly. Lungs: equal expansion, clear bilaterally CV: Apex is discrete and nondisplaced, RRR without murmur or gallop Abd: soft, NT, +BS, no bruit, no hepatosplenomegaly Back: no CVA tenderness Ext: no C/C/E        Femoral pulses 2+= without bruits        DP/PT pulses intact and = Skin: warm and dry without rash Neuro: CNII-XII intact             Strength intact = bilaterally  EKG:  NSR 75 bpm, LAFB, otherwise within normal limits  ASSESSMENT AND PLAN:

## 2011-02-01 ENCOUNTER — Ambulatory Visit (HOSPITAL_COMMUNITY): Payer: Medicare Other | Attending: Cardiovascular Disease | Admitting: Radiology

## 2011-02-01 VITALS — Ht 75.0 in | Wt 243.0 lb

## 2011-02-01 DIAGNOSIS — R5381 Other malaise: Secondary | ICD-10-CM | POA: Insufficient documentation

## 2011-02-01 DIAGNOSIS — I2510 Atherosclerotic heart disease of native coronary artery without angina pectoris: Secondary | ICD-10-CM

## 2011-02-01 DIAGNOSIS — I10 Essential (primary) hypertension: Secondary | ICD-10-CM | POA: Insufficient documentation

## 2011-02-01 DIAGNOSIS — R079 Chest pain, unspecified: Secondary | ICD-10-CM | POA: Insufficient documentation

## 2011-02-01 DIAGNOSIS — I498 Other specified cardiac arrhythmias: Secondary | ICD-10-CM

## 2011-02-01 DIAGNOSIS — Z8249 Family history of ischemic heart disease and other diseases of the circulatory system: Secondary | ICD-10-CM | POA: Insufficient documentation

## 2011-02-01 DIAGNOSIS — E785 Hyperlipidemia, unspecified: Secondary | ICD-10-CM | POA: Insufficient documentation

## 2011-02-01 DIAGNOSIS — E119 Type 2 diabetes mellitus without complications: Secondary | ICD-10-CM | POA: Insufficient documentation

## 2011-02-01 MED ORDER — TECHNETIUM TC 99M TETROFOSMIN IV KIT
33.0000 | PACK | Freq: Once | INTRAVENOUS | Status: AC | PRN
  Administered 2011-02-01: 33 via INTRAVENOUS

## 2011-02-01 MED ORDER — TECHNETIUM TC 99M TETROFOSMIN IV KIT
11.0000 | PACK | Freq: Once | INTRAVENOUS | Status: AC | PRN
  Administered 2011-02-01: 11 via INTRAVENOUS

## 2011-02-01 NOTE — Progress Notes (Signed)
[facility] SITE 3 NUCLEAR MED [address] [place] [address] [phone]  Cardiology Nuclear Med Study  Charles Underwood is a 65 y.o. male [id number] 1946-02-15   Nuclear Med Background Indication for Stress Test:  Evaluation for Ischemia History: 12/28/10 (CT): 3V Calcification ectatic ascending aorta 4cm, 12/08 Echo: EF 50%, . 12 yrs ago GXT: Va Nl per pt and 12/08 Heart Catheterization: N/O Dz LAD EF 60% Cardiac Risk Factors: Family History - CAD, Hypertension, Lipids and NIDDM  Symptoms:  Chest Pain and Fatigue   Nuclear Pre-Procedure Caffeine/Decaff Intake:  None NPO After: 7:00pm   Lungs: clear IV 0.9% NS with Angio Cath:  20g  IV Site: R Hand  IV Started by:  Bonnita Levan, RN  Chest Size (in):  48 Cup Size: n/a  Height: 6\' 3"  (1.905 m)  Weight:  243 lb (110.224 kg)  BMI:  Body mass index is 30.37 kg/(m^2). Tech Comments:  BS @ 6 am = 99    Nuclear Med Study 1 or 2 day study: 1 day  Stress Test Type:  Stress  Reading MD: Olga Millers, MD  Order Authorizing Provider:  Dr. Tonny Bollman  Resting Radionuclide: Technetium 58m Tetrofosmin  Resting Radionuclide Dose: 11 mCi   Stress Radionuclide:  Technetium 31m Tetrofosmin  Stress Radionuclide Dose: 33 mCi           Stress Protocol Rest HR: 57 Stress HR: 139  Rest BP: 97/74 Stress BP: 148/65  Exercise Time (min): 8:05 METS: 10.10   Predicted Max HR: 154 bpm % Max HR: 90.26 bpm Rate Pressure Product: 01027   Dose of Adenosine (mg):  n/a Dose of Lexiscan: n/a mg  Dose of Atropine (mg): n/a Dose of Dobutamine: n/a mcg/kg/min (at max HR)  Stress Test Technologist: Milana Na, EMT-P  Nuclear Technologist:  Domenic Polite, CNMT     Rest Procedure:  Myocardial perfusion imaging was performed at rest 45 minutes following the intravenous administration of Technetium 16m Tetrofosmin. Rest ECG: Sinus Bradycardia  Stress Procedure:  The patient exercised for 8:05.  The patient stopped due  to fatigue and denied any chest pain.  There were no significant ST-T wave changes.  Technetium 7m Tetrofosmin was injected at peak exercise and myocardial perfusion imaging was performed after a brief delay. Stress ECG: No significant ST segment change suggestive of ischemia.  QPS Raw Data Images:  Acquisition technically good; normal left ventricular size. Stress Images:  Normal homogeneous uptake in all areas of the myocardium. Rest Images:  Normal homogeneous uptake in all areas of the myocardium. Subtraction (SDS):  No evidence of ischemia. Transient Ischemic Dilatation (Normal <1.22):  1.08 Lung/Heart Ratio (Normal <0.45):  .30  Quantitative Gated Spect Images QGS EDV:  116 ml QGS ESV:  42 ml QGS cine images:  NL LV Function; NL Wall Motion QGS EF: 63%  Impression Exercise Capacity:  Good exercise capacity. BP Response:  Normal blood pressure response. Clinical Symptoms:  No chest pain. ECG Impression:  No significant ST segment change suggestive of ischemia. Comparison with Prior Nuclear Study: No images to compare  Overall Impression:  Normal stress nuclear study.   Olga Millers

## 2011-02-06 ENCOUNTER — Telehealth: Payer: Self-pay | Admitting: Cardiovascular Disease

## 2011-02-06 NOTE — Telephone Encounter (Signed)
Pt's wife aware of results by phone.

## 2011-02-06 NOTE — Telephone Encounter (Signed)
Stress test results from last wednesday

## 2011-02-27 ENCOUNTER — Ambulatory Visit (INDEPENDENT_AMBULATORY_CARE_PROVIDER_SITE_OTHER): Payer: Medicare Other | Admitting: Cardiovascular Disease

## 2011-02-27 ENCOUNTER — Encounter: Payer: Self-pay | Admitting: Cardiovascular Disease

## 2011-02-27 VITALS — BP 110/70 | HR 70 | Ht 75.0 in | Wt 246.0 lb

## 2011-02-27 DIAGNOSIS — I10 Essential (primary) hypertension: Secondary | ICD-10-CM

## 2011-02-27 DIAGNOSIS — I77819 Aortic ectasia, unspecified site: Secondary | ICD-10-CM

## 2011-02-27 DIAGNOSIS — I2510 Atherosclerotic heart disease of native coronary artery without angina pectoris: Secondary | ICD-10-CM

## 2011-02-27 DIAGNOSIS — I7789 Other specified disorders of arteries and arterioles: Secondary | ICD-10-CM

## 2011-02-27 NOTE — Assessment & Plan Note (Signed)
Considering the patient's family history is aortic dissection, recommend repeat CTA of the chest in one year for followup of his dilated ascending aorta.

## 2011-02-27 NOTE — Progress Notes (Signed)
Patient ID: MAYNOR MWANGI, male   DOB: 1945-05-20, 65 y.o.   MRN: [id number] HPI:  This is a 65 year old gentleman presenting for followup evaluation. He has a history of coronary artery disease diagnosed by three-vessel coronary artery calcification seen incidentally on CT scan. He was also noted to have a dilated aortic root of 4 cm.  The patient has had atypical anginal symptoms and in the setting of the above problems I elected to perform an exercise Myoview stress scan. This showed normal homogenous uptake with no evidence of ischemia and normal left ventricular ejection fraction.  The patient feels well at present. He denies chest pain or pressure. He denies dyspnea, edema, or palpitations. He does yard work and maintains an active lifestyle but is not engaged in regular physical exercise.  Outpatient Encounter Prescriptions as of 02/27/2011  Medication Sig Dispense Refill  . aspirin 81 MG tablet Take 81 mg by mouth daily.        . beta carotene w/minerals (OCUVITE) tablet Take 1 tablet by mouth daily.        . Cholecalciferol (VITAMIN D3) 1000 UNITS CAPS Take 2 capsules by mouth daily.        . flunisolide (NASALIDE) 0.025 % SOLN Inhale 2 sprays into the lungs 2 (two) times daily.        . hydrochlorothiazide (HYDRODIURIL) 25 MG tablet Take 25 mg by mouth daily.        Marland Kitchen losartan (COZAAR) 100 MG tablet Take 100 mg by mouth daily.        . metFORMIN (GLUCOPHAGE) 850 MG tablet Take 850 mg by mouth 2 (two) times daily with a meal.        . NON FORMULARY Eye drops each eye four times a day as needed       . Omega-3 Fatty Acids (FISH OIL) 1200 MG CAPS Take 1 capsule by mouth daily.        Marland Kitchen omeprazole (PRILOSEC) 20 MG capsule Take 20 mg by mouth daily. Two capsules daily       . pravastatin (PRAVACHOL) 80 MG tablet Take 80 mg by mouth daily.        . prazosin (MINIPRESS) 2 MG capsule Take 2 mg by mouth. Two capsules by mouth twice a day      . sodium chloride (OCEAN) 0.65 % nasal spray Place 1  spray into the nose as needed. Twice daily       . traZODone (DESYREL) 50 MG tablet Take 50 mg by mouth at bedtime. 3 tablets daily       . chlorthalidone (HYGROTON) 50 MG tablet Take 25 mg by mouth daily.          No Known Allergies  Past Medical History  Diagnosis Date  . Atypical chest pain   . Obesity   . Atherosclerosis 12/28/2010    on CTA of chest   . Hiatal hernia   . IBS (irritable bowel syndrome)   . Diverticulosis   . Hypertension   . Hemorrhoids   . Adenomatous polyp of colon 03/2005  . Hyperlipidemia   . Fatty infiltration of liver   . Thrombocytopenia   . OSA (obstructive sleep apnea)   . Diabetes mellitus   . Depression with anxiety   . CAD (coronary artery disease)     ROS: Negative except as per HPI  BP 110/70  Pulse 70  Ht 6\' 3"  (1.905 m)  Wt 111.585 kg (246 lb)  BMI 30.75 kg/m2  PHYSICAL  EXAM: Pt is alert and oriented, overweight male in NAD HEENT: normal Neck: JVP - normal, carotids 2+= without bruits Lungs: CTA bilaterally CV: RRR without murmur or gallop Abd: soft, NT, Positive BS, no hepatomegaly Ext: no C/C/E, distal pulses intact and equal Skin: warm/dry no rash  ASSESSMENT AND PLAN:

## 2011-02-27 NOTE — Assessment & Plan Note (Signed)
The patient's nuclear stress scan was reviewed and it shows normal perfusion and normal left ventricular function. We had a long discussion today that the mainstay of therapy will be directed at ongoing risk reduction efforts. The patient is on appropriate therapy with an aspirin and statin drug. He is on multidrug therapy for treatment of his hypertension and his blood pressure is very well controlled. I would like to see him back in one year for followup.

## 2011-02-27 NOTE — Patient Instructions (Addendum)
Your physician wants you to follow-up in: DECEMBER 2013. You will receive a reminder letter in the mail two months in advance. If you don't receive a letter, please call our office to schedule the follow-up appointment.  Non-Cardiac CT Angiography (CTA), is a special type of CT scan that uses a computer to produce multi-dimensional views of major blood vessels throughout the body. In CT angiography, a contrast material is injected through an IV to help visualize the blood vessels. (CTA of Chest with contrast to follow-up dilated aorta--due in October 2013)  Your physician recommends that you continue on your current medications as directed. Please refer to the Current Medication list given to you today.

## 2011-08-07 ENCOUNTER — Other Ambulatory Visit: Payer: Self-pay | Admitting: Dermatology

## 2011-08-21 ENCOUNTER — Other Ambulatory Visit: Payer: Self-pay | Admitting: Dermatology

## 2012-02-05 ENCOUNTER — Other Ambulatory Visit: Payer: Self-pay

## 2012-02-05 DIAGNOSIS — I77819 Aortic ectasia, unspecified site: Secondary | ICD-10-CM

## 2012-02-05 DIAGNOSIS — I2510 Atherosclerotic heart disease of native coronary artery without angina pectoris: Secondary | ICD-10-CM

## 2012-02-07 ENCOUNTER — Other Ambulatory Visit (INDEPENDENT_AMBULATORY_CARE_PROVIDER_SITE_OTHER): Payer: Medicare Other

## 2012-02-07 DIAGNOSIS — I2510 Atherosclerotic heart disease of native coronary artery without angina pectoris: Secondary | ICD-10-CM

## 2012-02-07 DIAGNOSIS — I77819 Aortic ectasia, unspecified site: Secondary | ICD-10-CM

## 2012-02-07 LAB — BASIC METABOLIC PANEL
BUN: 16 mg/dL (ref 6–23)
Calcium: 9 mg/dL (ref 8.4–10.5)
GFR: 73.99 mL/min (ref >60.00)
Potassium: 3.9 mEq/L (ref 3.5–5.1)
Sodium: 136 mEq/L (ref 135–145)

## 2012-02-09 ENCOUNTER — Telehealth: Payer: Self-pay

## 2012-02-09 NOTE — Telephone Encounter (Signed)
I spoke with the pt's wife and made her aware of BMP results.  The pt has been scheduled for CTA of Chest with and without contrast on 02/13/12 at 10:30 to evaluate dilated aorta. The pt should arrive for check-in at 10:15.  NPO 2 hours prior to procedure.  The pt will hold Metformin the morning of test and 48 hours after test.  The pt's wife verbalized understanding of instructions.

## 2012-02-13 ENCOUNTER — Ambulatory Visit (INDEPENDENT_AMBULATORY_CARE_PROVIDER_SITE_OTHER)
Admission: RE | Admit: 2012-02-13 | Discharge: 2012-02-13 | Disposition: A | Discharge status: Home / Self Care | Payer: Medicare Other | Source: Ambulatory Visit | Attending: Cardiovascular Disease | Admitting: Cardiovascular Disease

## 2012-02-13 DIAGNOSIS — I10 Essential (primary) hypertension: Secondary | ICD-10-CM

## 2012-02-13 DIAGNOSIS — I2510 Atherosclerotic heart disease of native coronary artery without angina pectoris: Secondary | ICD-10-CM

## 2012-02-13 MED ORDER — IOHEXOL 350 MG/ML SOLN
100.0000 mL | Freq: Once | INTRAVENOUS | Status: AC | PRN
  Administered 2012-02-13: 100 mL via INTRAVENOUS

## 2012-02-27 ENCOUNTER — Ambulatory Visit (INDEPENDENT_AMBULATORY_CARE_PROVIDER_SITE_OTHER): Payer: Medicare Other | Admitting: Cardiovascular Disease

## 2012-02-27 ENCOUNTER — Encounter: Payer: Self-pay | Admitting: Cardiovascular Disease

## 2012-02-27 VITALS — BP 106/82 | HR 67 | Ht 75.0 in | Wt 247.0 lb

## 2012-02-27 DIAGNOSIS — I7789 Other specified disorders of arteries and arterioles: Secondary | ICD-10-CM

## 2012-02-27 DIAGNOSIS — I2510 Atherosclerotic heart disease of native coronary artery without angina pectoris: Secondary | ICD-10-CM

## 2012-02-27 DIAGNOSIS — I10 Essential (primary) hypertension: Secondary | ICD-10-CM

## 2012-02-27 NOTE — Patient Instructions (Addendum)
Your physician wants you to follow-up in: 1 YEAR with Dr Cooper.  You will receive a reminder letter in the mail two months in advance. If you don't receive a letter, please call our office to schedule the follow-up appointment.  Your physician recommends that you continue on your current medications as directed. Please refer to the Current Medication list given to you today.  

## 2012-02-27 NOTE — Progress Notes (Signed)
HPI:  66 year old gentleman presenting for followup evaluation. The patient has diabetes, hypertension, and coronary artery disease diagnosed by chest CT. This happened be an incidental finding on prior CT and the patient was noted to have three-vessel coronary calcification. He underwent a Myoview stress scan demonstrating no ischemia. His left ventricle function is normal. He's had no anginal symptoms. He does complain of occasional left shoulder and upper back pain that he noted this morning before he got out of bed. However, he does yard work and other physical activity without any exertional symptoms. He specifically denies exertional chest pain or pressure. He's been compliant with his medications. He has not followed a close diet and admits that he likes bread and starchy foods. Despite that, he has managed to lose about 10-15 pounds.  Outpatient Encounter Prescriptions as of 02/27/2012  Medication Sig Dispense Refill  . aspirin 81 MG tablet Take 81 mg by mouth daily.        . beta carotene w/minerals (OCUVITE) tablet Take 1 tablet by mouth daily.        . chlorthalidone (HYGROTON) 50 MG tablet Take 25 mg by mouth daily.        . Cholecalciferol (VITAMIN D3) 1000 UNITS CAPS Take 2 capsules by mouth daily.        . flunisolide (NASALIDE) 0.025 % SOLN Inhale 2 sprays into the lungs 2 (two) times daily.        Marland Kitchen losartan (COZAAR) 100 MG tablet Take 100 mg by mouth daily.        . metFORMIN (GLUCOPHAGE) 850 MG tablet Take 850 mg by mouth 2 (two) times daily with a meal.        . Multiple Vitamins-Minerals (CENTRUM SILVER ADULT 50+ PO) Take by mouth. Take one tablet daily      . nefazodone (SERZONE) 200 MG tablet Take 100 mg by mouth 2 (two) times daily.      . NON FORMULARY Eye drops each eye four times a day as needed       . Omega-3 Fatty Acids 500 MG CAPS Take by mouth. Take one capsule daily      . omeprazole (PRILOSEC) 20 MG capsule Take 20 mg by mouth daily. Two capsules daily       .  pravastatin (PRAVACHOL) 40 MG tablet Take 40 mg by mouth daily.      . prazosin (MINIPRESS) 2 MG capsule Take 2 mg by mouth. Two capsules by mouth twice a day      . sodium chloride (OCEAN) 0.65 % nasal spray Place 1 spray into the nose as needed. Twice daily       . traZODone (DESYREL) 50 MG tablet Take 50 mg by mouth at bedtime. 3 tablets daily       . vitamin C (ASCORBIC ACID) 500 MG tablet Take 500 mg by mouth daily.      . [DISCONTINUED] hydrochlorothiazide (HYDRODIURIL) 25 MG tablet Take 25 mg by mouth daily.        . [DISCONTINUED] Omega-3 Fatty Acids (FISH OIL) 1200 MG CAPS Take 1 capsule by mouth daily.        . [DISCONTINUED] pravastatin (PRAVACHOL) 80 MG tablet Take 80 mg by mouth daily.          No Known Allergies  Past Medical History  Diagnosis Date  . Atypical chest pain   . Obesity   . Atherosclerosis 12/28/2010    on CTA of chest   . Hiatal hernia   .  IBS (irritable bowel syndrome)   . Diverticulosis   . Hypertension   . Hemorrhoids   . Adenomatous polyp of colon 03/2005  . Hyperlipidemia   . Fatty infiltration of liver   . Thrombocytopenia   . OSA (obstructive sleep apnea)   . Diabetes mellitus   . Depression with anxiety   . CAD (coronary artery disease)     ROS: Negative except as per HPI  BP 106/82  Pulse 67  Ht 6\' 3"  (1.905 m)  Wt 112.038 kg (247 lb)  BMI 30.87 kg/m2  SpO2 97%  PHYSICAL EXAM: Pt is alert and oriented, NAD HEENT: normal Neck: JVP - normal, carotids 2+= without bruits Lungs: CTA bilaterally CV: RRR without murmur or gallop Abd: soft, NT, Positive BS, no hepatomegaly Ext: no C/C/E, distal pulses intact and equal Skin: warm/dry no rash  EKG:  Normal sinus rhythm, heart rate 67 beats per minute, left axis deviation.  ASSESSMENT AND PLAN: 1. Coronary artery disease, native vessel. The patient is stable without anginal symptoms. His cardiac risk factors appear well controlled. We had a lengthy discussion about his need for regular  physical exercise with a focus on weight reduction. His medical program is excellent with aspirin for antiplatelet therapy, pravastatin for lipid lowering, and an ARB in the setting of his diabetes. I would like to see him back in 12 months for followup.  2. Dilated aortic root. CT chest was reviewed and the maximal dimension of his aortic root is 4 cm. This is unchanged from last year. Consider followup again in 2 years. Note he has a family history of aortic dissection.  3. Hyperlipidemia. Lipids are followed by his primary care physician. He is on a statin drug.  Tonny Bollman 02/27/2012 10:05 AM

## 2012-06-10 ENCOUNTER — Other Ambulatory Visit: Payer: Self-pay | Admitting: Dermatology

## 2013-02-28 ENCOUNTER — Ambulatory Visit (INDEPENDENT_AMBULATORY_CARE_PROVIDER_SITE_OTHER): Payer: Medicare (Managed Care) | Admitting: Cardiovascular Disease

## 2013-02-28 ENCOUNTER — Encounter: Payer: Self-pay | Admitting: Cardiovascular Disease

## 2013-02-28 VITALS — BP 135/86 | HR 83 | Ht 74.0 in | Wt 243.0 lb

## 2013-02-28 DIAGNOSIS — I10 Essential (primary) hypertension: Secondary | ICD-10-CM

## 2013-02-28 DIAGNOSIS — I2510 Atherosclerotic heart disease of native coronary artery without angina pectoris: Secondary | ICD-10-CM

## 2013-02-28 DIAGNOSIS — Q2543 Congenital aneurysm of aorta: Secondary | ICD-10-CM

## 2013-02-28 DIAGNOSIS — I7789 Other specified disorders of arteries and arterioles: Secondary | ICD-10-CM

## 2013-02-28 DIAGNOSIS — I719 Aortic aneurysm of unspecified site, without rupture: Secondary | ICD-10-CM

## 2013-02-28 LAB — BASIC METABOLIC PANEL
BUN: 17 mg/dL (ref 6–23)
Calcium: 9.2 mg/dL (ref 8.4–10.5)
Creatinine, Ser: 1.2 mg/dL (ref 0.4–1.5)
GFR: 66.47 mL/min (ref >60.00)
Glucose, Bld: 120 mg/dL — ABNORMAL HIGH (ref 70–99)

## 2013-02-28 NOTE — Progress Notes (Signed)
HPI:  67 year old gentleman presenting for followup evaluation. The patient is followed for coronary artery disease, dilated aortic root, and hypertension. His CAD was diagnosed incidentally on a CT scan of the chest when he was noted to have three-vessel coronary calcification. He underwent nuclear stress testing in 2012 and this demonstrated no myocardial ischemia. The patient has a 4 cm ascending aorta and this is been followed by CT angiography.  The patient is doing well clinically. He exercises sporadically with walking. He has no exertional symptoms. He specifically denies chest pain or pressure, dyspnea, edema, or palpitations. He's been watching his diet more carefully of late.  Outpatient Encounter Prescriptions as of 02/28/2013  Medication Sig  . aspirin 81 MG tablet Take 81 mg by mouth daily.    . chlorthalidone (HYGROTON) 50 MG tablet Take 25 mg by mouth daily.    . Cholecalciferol (VITAMIN D3) 1000 UNITS CAPS Take 2 capsules by mouth daily.    . flunisolide (NASALIDE) 0.025 % SOLN Inhale 2 sprays into the lungs 2 (two) times daily.    Marland Kitchen losartan (COZAAR) 100 MG tablet Take 100 mg by mouth daily.    . metFORMIN (GLUCOPHAGE) 850 MG tablet Take 850 mg by mouth 2 (two) times daily with a meal.    . Multiple Vitamins-Minerals (CENTRUM SILVER ADULT 50+ PO) Take by mouth. Take one tablet daily  . nefazodone (SERZONE) 200 MG tablet Take 100 mg by mouth 2 (two) times daily.  . Omega-3 Fatty Acids 500 MG CAPS Take by mouth. Take one capsule daily  . omeprazole (PRILOSEC) 20 MG capsule Take 20 mg by mouth daily. Two capsules daily   . pravastatin (PRAVACHOL) 40 MG tablet Take 40 mg by mouth daily.  . prazosin (MINIPRESS) 2 MG capsule Take 2 mg by mouth. Two capsules by mouth twice a day  . sodium chloride (OCEAN) 0.65 % nasal spray Place 1 spray into the nose as needed. Twice daily   . traZODone (DESYREL) 50 MG tablet Take 50 mg by mouth at bedtime. 3 tablets daily   . vitamin C  (ASCORBIC ACID) 500 MG tablet Take 500 mg by mouth daily.  . [DISCONTINUED] beta carotene w/minerals (OCUVITE) tablet Take 1 tablet by mouth daily.    . [DISCONTINUED] NON FORMULARY Eye drops each eye four times a day as needed     No Known Allergies  Past Medical History  Diagnosis Date  . Atypical chest pain   . Obesity   . Atherosclerosis 12/28/2010    on CTA of chest   . Hiatal hernia   . IBS (irritable bowel syndrome)   . Diverticulosis   . Hypertension   . Hemorrhoids   . Adenomatous polyp of colon 03/2005  . Hyperlipidemia   . Fatty infiltration of liver   . Thrombocytopenia   . OSA (obstructive sleep apnea)   . Diabetes mellitus   . Depression with anxiety   . CAD (coronary artery disease)     ROS: Negative except as per HPI  BP 135/86  Pulse 83  Ht 6\' 2"  (1.88 m)  Wt 243 lb (110.224 kg)  BMI 31.19 kg/m2  PHYSICAL EXAM: Pt is alert and oriented, NAD HEENT: normal Neck: JVP - normal, carotids 2+= without bruits Lungs: CTA bilaterally CV: RRR without murmur or gallop Abd: soft, NT, Positive BS, no hepatomegaly Ext: no C/C/E, distal pulses intact and equal Skin: warm/dry no rash  EKG:  Normal sinus rhythm 83 beats per minute, left axis deviation.  Myoview 02/01/2011: QPS  Raw Data Images: Acquisition technically good; normal left ventricular size.  Stress Images: Normal homogeneous uptake in all areas of the myocardium.  Rest Images: Normal homogeneous uptake in all areas of the myocardium.  Subtraction (SDS): No evidence of ischemia.  Transient Ischemic Dilatation (Normal <1.22): 1.08  Lung/Heart Ratio (Normal <0.45): .30  Quantitative Gated Spect Images  QGS EDV: 116 ml  QGS ESV: 42 ml  QGS cine images: NL LV Function; NL Wall Motion  QGS EF: 63%  Impression  Exercise Capacity: Good exercise capacity.  BP Response: Normal blood pressure response.  Clinical Symptoms: No chest pain.  ECG Impression: No significant ST segment change suggestive of  ischemia.  Comparison with Prior Nuclear Study: No images to compare  Overall Impression: Normal stress nuclear study.   CTA Chest 12/28/2010: Findings: Ascending aortic ectasia is present with the ascending aorta at the level of the pulmonary artery measuring 40 mm. Descending thoracic aorta has a normal diameter, measuring 30 mm. There is no aortic dissection or acute aortic abnormality. Mild tortuosity and atherosclerotic calcification of the aortic arch is present. Coronary artery atherosclerosis is present affecting the left anterior descending, left circumflex and probably the right coronary artery.  If office based assessment of coronary risk factors has not been performed, it is now recommended.  No central pulmonary embolus is identified. Arterial phase imaging was obtained. Nonspecific left thyroid lobe sub centimeter lesion is present. At that nonspecific left thyroid lobe sub centimeter lesion is present, unchanged from prior exam.  The aortic branch vessels appear patent. There is no axillary adenopathy. No mediastinal or hilar adenopathy. The thoracic esophagus appears within normal limits. Incidental imaging the upper abdomen shows a 4 cm left upper pole renal cyst, unchanged from prior. Patulous gastroesophageal junction with small hiatal hernia. No effusion. The lungs show dependent atelectasis. No airspace disease. Stable 5 mm pulmonary nodule adjacent to the left major fissure (image 34 series 6)  Compatible with a subpleural lymph node. Central airways are patent. Thoracic spine DISH. No aggressive osseous lesions are present.  Review of the MIP images confirms the above findings.  IMPRESSION: 1. No acute aortic abnormality. Ascending aortic ectasia measuring 4 cm. Aortic arch atherosclerosis.  2. Coronary artery disease. 3. Small hiatal hernia.  ASSESSMENT AND PLAN: 1. Coronary atherosclerosis, native vessel. The patient is stable without symptoms of  angina. He is on a good medical program which includes aspirin, losartan, and pravastatin. Recommend continued risk reduction measures. We spoke extensively about the need to increase exercise and continue to work at weight loss.  2. Dilated aorta. The patient has a family history of aortic dissection. Recommend followup chest MRA.  3. Hypertension. Patient's blood pressure is well controlled. On my recheck it was 110/76. He will continue his current medical program.  4. Hyperlipidemia. Lipids followed by Dr. Tanya Nones. The patient is on pravastatin.  For followup I will see him back in one year.  Tonny Bollman 02/28/2013 9:36 AM

## 2013-02-28 NOTE — Patient Instructions (Addendum)
Your physician recommends that you return for lab work in: today/ bmet  Your physician has requested that you have a cardiac MRA Cardiac MRA uses a computer to create images of your heart and your aortic root to check for enlargement..Please follow the instruction sheet given to you today for more information.  Your physician wants you to follow-up in: 1 year You will receive a reminder letter in the mail two months in advance. If you don't receive a letter, please call our office to schedule the follow-up appointment.

## 2013-03-05 ENCOUNTER — Ambulatory Visit (HOSPITAL_COMMUNITY)
Admission: RE | Admit: 2013-03-05 | Discharge: 2013-03-05 | Disposition: A | Discharge status: Home / Self Care | Payer: 59 | Source: Ambulatory Visit | Attending: Cardiovascular Disease | Admitting: Cardiovascular Disease

## 2013-03-05 DIAGNOSIS — I7789 Other specified disorders of arteries and arterioles: Secondary | ICD-10-CM

## 2013-03-05 DIAGNOSIS — I10 Essential (primary) hypertension: Secondary | ICD-10-CM

## 2013-03-05 DIAGNOSIS — I719 Aortic aneurysm of unspecified site, without rupture: Secondary | ICD-10-CM

## 2013-03-05 DIAGNOSIS — I2510 Atherosclerotic heart disease of native coronary artery without angina pectoris: Secondary | ICD-10-CM

## 2013-03-05 DIAGNOSIS — I714 Abdominal aortic aneurysm, without rupture, unspecified: Secondary | ICD-10-CM | POA: Insufficient documentation

## 2013-03-05 MED ORDER — GADOBENATE DIMEGLUMINE 529 MG/ML IV SOLN
20.0000 mL | Freq: Once | INTRAVENOUS | Status: AC
  Administered 2013-03-05: 20 mL via INTRAVENOUS

## 2013-03-11 ENCOUNTER — Encounter: Payer: Self-pay | Admitting: Cardiovascular Disease

## 2013-03-11 NOTE — Telephone Encounter (Signed)
New message  Patient has a MRA done last Wednesday and would like to know the results, please call and advise.

## 2013-03-11 NOTE — Telephone Encounter (Signed)
This encounter was created in error - please disregard.

## 2013-06-10 ENCOUNTER — Other Ambulatory Visit: Payer: Self-pay | Admitting: Dermatology

## 2013-09-24 ENCOUNTER — Ambulatory Visit: Payer: Self-pay | Admitting: Physician Assistant

## 2013-09-29 ENCOUNTER — Ambulatory Visit: Payer: Self-pay | Admitting: Family Medicine

## 2013-10-08 ENCOUNTER — Telehealth: Payer: Self-pay | Admitting: Family Medicine

## 2013-10-08 NOTE — Telephone Encounter (Signed)
I called and spoke to Pt he gets his CPE and Labs done at [facility]

## 2014-01-23 ENCOUNTER — Encounter: Payer: Self-pay | Admitting: Cardiovascular Disease

## 2014-03-19 ENCOUNTER — Ambulatory Visit (INDEPENDENT_AMBULATORY_CARE_PROVIDER_SITE_OTHER): Payer: 59 | Admitting: Cardiovascular Disease

## 2014-03-19 ENCOUNTER — Encounter: Payer: Self-pay | Admitting: Cardiovascular Disease

## 2014-03-19 VITALS — BP 118/82 | HR 67 | Ht 74.0 in | Wt 244.0 lb

## 2014-03-19 DIAGNOSIS — I77810 Thoracic aortic ectasia: Secondary | ICD-10-CM

## 2014-03-19 DIAGNOSIS — I2510 Atherosclerotic heart disease of native coronary artery without angina pectoris: Secondary | ICD-10-CM

## 2014-03-19 LAB — BASIC METABOLIC PANEL
BUN: 18 mg/dL (ref 6–23)
CO2: 28 mEq/L (ref 19–32)
Calcium: 9.4 mg/dL (ref 8.4–10.5)
Chloride: 100 mEq/L (ref 96–112)
Creat: 1.2 mg/dL (ref 0.50–1.35)
Glucose, Bld: 113 mg/dL — ABNORMAL HIGH (ref 70–99)
POTASSIUM: 3.7 meq/L (ref 3.5–5.3)
SODIUM: 138 meq/L (ref 135–145)

## 2014-03-19 NOTE — Patient Instructions (Addendum)
Your physician wants you to follow-up in: 1 year with Dr. Burt Knack. You will receive a reminder letter in the mail two months in advance. If you don't receive a letter, please call our office to schedule the follow-up appointment.  Dr. Burt Knack has recommended MRA of chest with and without contrast in January 2016. Do NOT take Metformin the day prior to test and 48 hours after test.  Your physician recommends that you return for lab work in: [place] (BMP)

## 2014-03-19 NOTE — Progress Notes (Signed)
Background: The patient is followed for coronary artery disease, dilated aortic root, and hypertension. His CAD was diagnosed incidentally on a CT scan of the chest when he was noted to have three-vessel coronary calcification. He underwent nuclear stress testing in 2012 and this demonstrated no myocardial ischemia. The patient has a 4 cm ascending aorta and this is been followed by CT angiography.  HPI:  68 year old gentleman presenting for follow-up evaluation. He has been doing well since I last saw him. He's fairly sedentary and has not been engaged in any regular exercise. He denies chest pain or chest pressure with physical exertion. He has no dyspnea, edema, or heart palpitations. He has an occasional discomfort in the epigastrium and lower chest that occurs at rest. He thinks this may be related to a hiatal hernia. No other symptoms are reported.  Studies:  MRA 03/05/2013: IMPRESSION: Aneurysmal dilatation of the aorta has slightly enlarged. Maximal ascending diameter has increased from 4.0 studies on the prior study to 4.1 cm on the present study.   Outpatient Encounter Prescriptions as of 03/19/2014  Medication Sig  . aspirin 81 MG tablet Take 81 mg by mouth daily.    . chlorthalidone (HYGROTON) 50 MG tablet Take 25 mg by mouth daily.    . Cholecalciferol (VITAMIN D3) 1000 UNITS CAPS Take 2 capsules by mouth daily.    . Cinnamon 500 MG capsule Take 500 mg by mouth daily.  . Coenzyme Q10 (CO Q 10) 100 MG CAPS Take 1 capsule by mouth every morning.  . flunisolide (NASALIDE) 0.025 % SOLN Inhale 2 sprays into the lungs 2 (two) times daily.    Marland Kitchen losartan (COZAAR) 100 MG tablet Take 100 mg by mouth daily.    . metFORMIN (GLUCOPHAGE) 850 MG tablet Take 850 mg by mouth 2 (two) times daily with a meal.    . Multiple Vitamins-Minerals (CENTRUM SILVER ADULT 50+ PO) Take by mouth. Take one tablet daily  . nefazodone (SERZONE) 200 MG tablet Take 100 mg by mouth 2 (two) times daily.  .  Omega-3 Krill Oil 500 MG CAPS Take 1 capsule by mouth daily after lunch.  Marland Kitchen omeprazole (PRILOSEC) 20 MG capsule Take 20 mg by mouth daily. Two capsules daily   . pravastatin (PRAVACHOL) 40 MG tablet Take 40 mg by mouth daily.  . prazosin (MINIPRESS) 2 MG capsule Take 2 mg by mouth. Two capsules by mouth twice a day  . sodium chloride (OCEAN) 0.65 % nasal spray Place 1 spray into the nose as needed. Twice daily   . traZODone (DESYREL) 50 MG tablet Take 50 mg by mouth at bedtime. 3 tablets daily   . vitamin C (ASCORBIC ACID) 500 MG tablet Take 500 mg by mouth daily.  . [DISCONTINUED] Omega-3 Fatty Acids 500 MG CAPS Take by mouth. Take one capsule daily    No Known Allergies  Past Medical History  Diagnosis Date  . Atypical chest pain   . Obesity   . Atherosclerosis 12/28/2010    on CTA of chest   . Hiatal hernia   . IBS (irritable bowel syndrome)   . Diverticulosis   . Hypertension   . Hemorrhoids   . Adenomatous polyp of colon 03/2005  . Hyperlipidemia   . Fatty infiltration of liver   . Thrombocytopenia   . OSA (obstructive sleep apnea)   . Diabetes mellitus   . Depression with anxiety   . CAD (coronary artery disease)     family history includes Colon cancer in his  maternal grandfather; Colon polyps in his maternal grandfather.   ROS: Negative except as per HPI  BP 118/82 mmHg  Pulse 67  Ht 6\' 2"  (1.88 m)  Wt 244 lb (110.678 kg)  BMI 31.31 kg/m2  PHYSICAL EXAM: Pt is alert and oriented, NAD HEENT: normal Neck: JVP - normal, carotids 2+= without bruits Lungs: CTA bilaterally CV: RRR without murmur or gallop Abd: soft, NT, Positive BS, no hepatomegaly Ext: no C/C/E, distal pulses intact and equal Skin: warm/dry no rash  EKG:  Normal sinus rhythm 67 bpm, left axis deviation  ASSESSMENT AND PLAN: 1. Coronary artery disease, native vessel. The patient is stable on his current medical program. Medications were reviewed today. His chest pains are highly atypical and  not associated with physical exertion. I do not think he requires any further evaluation at this time. His last stress Myoview scan was within normal limits. His coronary disease was diagnosed incidentally on a CT scan of the chest.  2. Dilated aortic root. He is due for a follow-up MRI of the chest. If his aortic root is stable will plan on following him about every other year considering the fact that it is only mildly dilated at 4.1 cm on his most recent study.  3. Essential hypertension. Blood pressure is well controlled.  4. Hyperlipidemia. He is tolerating a statin drug. Lipids are followed by his primary care physician. Sherren Mocha, MD 03/19/2014 12:52 PM

## 2014-04-06 ENCOUNTER — Ambulatory Visit (HOSPITAL_COMMUNITY)
Admission: RE | Admit: 2014-04-06 | Discharge: 2014-04-06 | Disposition: A | Discharge status: Home / Self Care | Payer: Medicare Other | Source: Ambulatory Visit | Attending: Cardiovascular Disease | Admitting: Cardiovascular Disease

## 2014-04-06 DIAGNOSIS — I77810 Thoracic aortic ectasia: Secondary | ICD-10-CM

## 2014-04-06 DIAGNOSIS — I712 Thoracic aortic aneurysm, without rupture: Secondary | ICD-10-CM | POA: Insufficient documentation

## 2014-04-06 MED ORDER — GADOBENATE DIMEGLUMINE 529 MG/ML IV SOLN
20.0000 mL | Freq: Once | INTRAVENOUS | Status: AC | PRN
  Administered 2014-04-06: 20 mL via INTRAVENOUS

## 2014-07-16 ENCOUNTER — Telehealth: Payer: Self-pay | Admitting: Cardiovascular Disease

## 2014-07-16 NOTE — Telephone Encounter (Signed)
New message      Pt goes to the [facility] in [place].  The medical doctor there need to know why pt is not on a beta blocker.  He has an aneurysm.  Call pt and they will relay the message to them.  They need this info for their records

## 2014-07-16 NOTE — Telephone Encounter (Signed)
Per wife - pt saw Dr. Thedore Mins Il'Giovine who is requesting something in writing from Dr Burt Knack stating why the patient is not on a beta-blocker.  The address is [facility], [facility], [address], [place], [place] [address].  Pt is also requesting his last OV note be sent to Dr. David Stall.  Advised I will forward this request to Dr Burt Knack and his nurse.  Wife states the pt's BP had been elevated when he was recently seen at the [facility] because he had stopped his medication d/t fatigue.  Pt has since restarted the meds and BP is under better control.

## 2014-07-17 NOTE — Telephone Encounter (Signed)
BP has been under ideal control at all office visits, no angina, normal LV function. [facility] for beta-blocker if some indication for such, but with BP on average in range of 100 mmHg I'm not sure the patient will tolerate additional antihypertensive well. Can send my last office note.   Sherren Mocha 07/17/2014 11:11 AM

## 2014-07-17 NOTE — Telephone Encounter (Signed)
I spoke with the pt's wife and made her aware that I will mail this telephone encounter and last office visit note to the [facility] at address provided.

## 2014-09-14 ENCOUNTER — Other Ambulatory Visit: Payer: Self-pay

## 2014-10-05 ENCOUNTER — Encounter: Payer: Self-pay | Admitting: Family Medicine

## 2014-10-05 ENCOUNTER — Ambulatory Visit (INDEPENDENT_AMBULATORY_CARE_PROVIDER_SITE_OTHER): Payer: Medicare Other | Admitting: Family Medicine

## 2014-10-05 VITALS — BP 92/56 | HR 64 | Temp 97.9°F | Resp 16 | Ht 74.0 in | Wt 241.0 lb

## 2014-10-05 DIAGNOSIS — M542 Cervicalgia: Secondary | ICD-10-CM | POA: Diagnosis not present

## 2014-10-05 NOTE — Progress Notes (Signed)
Subjective:    Patient ID: Charles Underwood, male    DOB: 06/01/1945, 69 y.o.   MRN: [id number]  HPI Beginning last week, the patient developed severe intermittent pain in the right anterior neck. The pain is located near the carotid artery and radiates up the path of carotid artery to just behind the right ear. The pain is pulsating in nature. It then resolves spontaneously. There are no exacerbating or alleviating factors. He denies any pain with rotation of the neck. He denies any pain with flexion or extension of the neck. He denies any neck injury. He denies any numbness or tingling in his arms. He denies any arm weakness. He denies any trouble swallowing. He denies any trouble breathing. He denies any sore throat or fever. On examination today I can appreciate no carotid bruit. I can appreciate no pulsatile mass in the anterior neck. I can appreciate no lymphadenopathy or thyromegaly in the neck Past Medical History  Diagnosis Date  . Atypical chest pain   . Obesity   . Atherosclerosis 12/28/2010    on CTA of chest   . Hiatal hernia   . IBS (irritable bowel syndrome)   . Diverticulosis   . Hypertension   . Hemorrhoids   . Adenomatous polyp of colon 03/2005  . Hyperlipidemia   . Fatty infiltration of liver   . Thrombocytopenia   . OSA (obstructive sleep apnea)   . Diabetes mellitus   . Depression with anxiety   . CAD (coronary artery disease)    No past surgical history on file. Current Outpatient Prescriptions on File Prior to Visit  Medication Sig Dispense Refill  . aspirin 81 MG tablet Take 81 mg by mouth daily.      . chlorthalidone (HYGROTON) 50 MG tablet Take 25 mg by mouth daily.      . Cholecalciferol (VITAMIN D3) 1000 UNITS CAPS Take 2 capsules by mouth daily.      . Cinnamon 500 MG capsule Take 500 mg by mouth daily.    . Coenzyme Q10 (CO Q 10) 100 MG CAPS Take 1 capsule by mouth every morning.    . flunisolide (NASALIDE) 0.025 % SOLN Inhale 2 sprays into the lungs 2  (two) times daily.      Marland Kitchen losartan (COZAAR) 100 MG tablet Take 100 mg by mouth daily.      . metFORMIN (GLUCOPHAGE) 850 MG tablet Take 850 mg by mouth 2 (two) times daily with a meal.      . Multiple Vitamins-Minerals (CENTRUM SILVER ADULT 50+ PO) Take by mouth. Take one tablet daily    . nefazodone (SERZONE) 200 MG tablet Take 100 mg by mouth 2 (two) times daily.    . Omega-3 Krill Oil 500 MG CAPS Take 1 capsule by mouth daily after lunch.    Marland Kitchen omeprazole (PRILOSEC) 20 MG capsule Take 20 mg by mouth daily. Two capsules daily     . pravastatin (PRAVACHOL) 40 MG tablet Take 40 mg by mouth daily.    . prazosin (MINIPRESS) 2 MG capsule Take 2 mg by mouth. Two capsules by mouth twice a day    . sodium chloride (OCEAN) 0.65 % nasal spray Place 1 spray into the nose as needed. Twice daily     . traZODone (DESYREL) 50 MG tablet Take 50 mg by mouth at bedtime. 3 tablets daily     . vitamin C (ASCORBIC ACID) 500 MG tablet Take 500 mg by mouth daily.     No current  facility-administered medications on file prior to visit.   No Known Allergies History   Social History  . Marital Status: Married    Spouse Name: N/A  . Number of Children: 4  . Years of Education: N/A   Occupational History  . Retired     Nature conservation officer    Social History Main Topics  . Smoking status: Never Smoker   . Smokeless tobacco: Never Used  . Alcohol Use: No  . Drug Use: No  . Sexual Activity: Not on file   Other Topics Concern  . Not on file   Social History Narrative   0 caffeine drinks daily       Review of Systems  All other systems reviewed and are negative.      Objective:   Physical Exam  Constitutional: He appears well-developed and well-nourished.  Neck: Neck supple. No JVD present. No thyromegaly present.  Cardiovascular: Normal rate, regular rhythm, normal heart sounds and intact distal pulses.   No murmur heard. Pulses:      Carotid pulses are 2+ on the right side, and 2+ on the left  side. Pulmonary/Chest: Effort normal and breath sounds normal. No respiratory distress. He has no wheezes. He has no rales.  Lymphadenopathy:    He has no cervical adenopathy.  Vitals reviewed.         Assessment & Plan:  Anterior neck pain - Plan: CT Soft Tissue Neck W Contrast  Pain is severe at times, 10 over 10 and then resolve spontaneously. Given the intermittent pounding nature of the pain, the location of the pain, and the radiation of the pain, I am concerned about possible pathology within the carotid artery. On examination today there is no clinical signs of a emergency. I do not feel the patient requires urgent ER evaluation. However I would like to obtain a CT scan of the neck with contrast to rule out a carotid artery pathology such as dissection.  Other possible causes would be cervical degenerative disc disease with nerve impingement although the pain is very anterior in location, sudden in nature, and not exacerbated by movement

## 2014-10-09 ENCOUNTER — Ambulatory Visit
Admission: RE | Admit: 2014-10-09 | Discharge: 2014-10-09 | Disposition: A | Discharge status: Home / Self Care | Payer: Medicare Other | Source: Ambulatory Visit | Attending: Family Medicine | Admitting: Family Medicine

## 2014-10-09 ENCOUNTER — Other Ambulatory Visit: Payer: Self-pay | Admitting: Family Medicine

## 2014-10-09 DIAGNOSIS — M542 Cervicalgia: Secondary | ICD-10-CM

## 2014-10-09 MED ORDER — IOPAMIDOL (ISOVUE-370) INJECTION 76%
80.0000 mL | Freq: Once | INTRAVENOUS | Status: AC | PRN
  Administered 2014-10-09: 80 mL via INTRAVENOUS

## 2014-10-15 ENCOUNTER — Encounter: Payer: Self-pay | Admitting: Family Medicine

## 2014-10-15 ENCOUNTER — Ambulatory Visit (INDEPENDENT_AMBULATORY_CARE_PROVIDER_SITE_OTHER): Payer: Medicare Other | Admitting: Family Medicine

## 2014-10-15 VITALS — BP 118/64 | HR 84 | Temp 98.0°F | Resp 16 | Ht 74.0 in | Wt 242.0 lb

## 2014-10-15 DIAGNOSIS — R599 Enlarged lymph nodes, unspecified: Secondary | ICD-10-CM

## 2014-10-15 DIAGNOSIS — M47812 Spondylosis without myelopathy or radiculopathy, cervical region: Secondary | ICD-10-CM | POA: Insufficient documentation

## 2014-10-15 DIAGNOSIS — E049 Nontoxic goiter, unspecified: Secondary | ICD-10-CM | POA: Insufficient documentation

## 2014-10-15 DIAGNOSIS — R590 Localized enlarged lymph nodes: Secondary | ICD-10-CM

## 2014-10-15 NOTE — Progress Notes (Signed)
Subjective:    Patient ID: Charles Underwood, male    DOB: [birth year], 69 y.o.   MRN: [id number]  HPI 10/05/14 Beginning last week, the patient developed severe intermittent pain in the right anterior neck. The pain is located near the carotid artery and radiates up the path of carotid artery to just behind the right ear. The pain is pulsating in nature. It then resolves spontaneously. There are no exacerbating or alleviating factors. He denies any pain with rotation of the neck. He denies any pain with flexion or extension of the neck. He denies any neck injury. He denies any numbness or tingling in his arms. He denies any arm weakness. He denies any trouble swallowing. He denies any trouble breathing. He denies any sore throat or fever. On examination today I can appreciate no carotid bruit. I can appreciate no pulsatile mass in the anterior neck. I can appreciate no lymphadenopathy or thyromegaly in the neck.  AT that time, my plan was: Pain is severe at times, 10 over 10 and then resolve spontaneously. Given the intermittent pounding nature of the pain, the location of the pain, and the radiation of the pain, I am concerned about possible pathology within the carotid artery. On examination today there is no clinical signs of a emergency. I do not feel the patient requires urgent ER evaluation. However I would like to obtain a CT scan of the neck with contrast to rule out a carotid artery pathology such as dissection.  Other possible causes would be cervical degenerative disc disease with nerve impingement although the pain is very anterior in location, sudden in nature, and not exacerbated by movement  10/15/14 CT angiogram of the neck was performed and revealed no carotid dissection. However it did show several coincidental findings. 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 This is been a known finding and is monitored by his cardiologist. He was also found to have several small nodules in his  thyroid gland. However there is no definitive mass warranting biopsy at this time. 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 I believe this is actually the cause of the patient's pain. Ironically the patient's pain has improved. He has been pain-free since that appointment. The CT angiogram did see some flattening of the spinal cord at this level secondary to impingement. I believe this is the source of the patient's pain but it is already beginning to improve and therefore I see no indication for surgery. However there was a coincidental finding of a 1 cm x 1.3 cm x 2 cm abnormal mass at the inferior portion of the parotid gland on the right side. This is favored to be an abnormal lymph node. However it is not fully characterized on the CT angiogram Past Medical History  Diagnosis Date  . Atypical chest pain   . Obesity   . Atherosclerosis 12/28/2010    on CTA of chest   . Hiatal hernia   . IBS (irritable bowel syndrome)   . Diverticulosis   . Hypertension   . Hemorrhoids   . Adenomatous polyp of colon 03/2005  . Hyperlipidemia   . Fatty infiltration of liver   . Thrombocytopenia   . OSA (obstructive sleep apnea)   . Diabetes mellitus   . Depression with anxiety   . CAD (coronary artery disease)   . Cervical spondylosis   .  Nodular goiter    No past surgical history on file. Current Outpatient Prescriptions on File Prior to Visit  Medication Sig Dispense Refill  . aspirin 81 MG tablet Take 81 mg by mouth daily.      . chlorthalidone (HYGROTON) 50 MG tablet Take 25 mg by mouth daily.      . Cholecalciferol (VITAMIN D3) 1000 UNITS CAPS Take 2 capsules by mouth daily.      . Cinnamon 500 MG capsule Take 500 mg by mouth daily.    . Coenzyme Q10 (CO Q 10) 100 MG CAPS Take 1 capsule by mouth every morning.    . flunisolide (NASALIDE) 0.025 % SOLN Inhale 2 sprays into the  lungs 2 (two) times daily.      Marland Kitchen losartan (COZAAR) 100 MG tablet Take 100 mg by mouth daily.      . metFORMIN (GLUCOPHAGE) 850 MG tablet Take 850 mg by mouth 2 (two) times daily with a meal.      . Multiple Vitamins-Minerals (CENTRUM SILVER ADULT 50+ PO) Take by mouth. Take one tablet daily    . nefazodone (SERZONE) 200 MG tablet Take 100 mg by mouth 2 (two) times daily.    . Omega-3 Krill Oil 500 MG CAPS Take 1 capsule by mouth daily after lunch.    Marland Kitchen omeprazole (PRILOSEC) 20 MG capsule Take 20 mg by mouth daily. Two capsules daily     . pravastatin (PRAVACHOL) 40 MG tablet Take 40 mg by mouth daily.    . prazosin (MINIPRESS) 2 MG capsule Take 2 mg by mouth. Two capsules by mouth twice a day    . sodium chloride (OCEAN) 0.65 % nasal spray Place 1 spray into the nose as needed. Twice daily     . traZODone (DESYREL) 50 MG tablet Take 50 mg by mouth at bedtime. 3 tablets daily     . vitamin C (ASCORBIC ACID) 500 MG tablet Take 500 mg by mouth daily.     No current facility-administered medications on file prior to visit.   No Known Allergies History   Social History  . Marital Status: Married    Spouse Name: N/A  . Number of Children: 4  . Years of Education: N/A   Occupational History  . Retired     Nature conservation officer    Social History Main Topics  . Smoking status: Never Smoker   . Smokeless tobacco: Never Used  . Alcohol Use: No  . Drug Use: No  . Sexual Activity: Not on file   Other Topics Concern  . Not on file   Social History Narrative   0 caffeine drinks daily       Review of Systems  All other systems reviewed and are negative.      Objective:   Physical Exam  Constitutional: He appears well-developed and well-nourished.  Neck: Neck supple. No JVD present. No thyromegaly present.  Cardiovascular: Normal rate, regular rhythm, normal heart sounds and intact distal pulses.   No murmur heard. Pulses:      Carotid pulses are 2+ on the right side, and 2+ on the left  side. Pulmonary/Chest: Effort normal and breath sounds normal. No respiratory distress. He has no wheezes. He has no rales.  Lymphadenopathy:    He has no cervical adenopathy.  Vitals reviewed.         Assessment & Plan:  Lymphadenopathy, anterior cervical - Plan: US Soft Tissue Head/Neck  I have recommended that we pursue an ultrasound of the thyroid gland  in one year to monitor for any growth of the nodules of the thyroid gland. I recommended that we get a dedicated ultrasound of the parotid gland to evaluate this mass to see if it is related to the parotid gland or to see if it is related to an abnormal lymph node. Patient may require fine-needle aspiration biopsy but I'll await the results of the ultrasound first. Regarding the patient's pain there is no evidence of a carotid artery dissection. Instead the patient is found to have spinal stenosis and cervical spondylosis. However his pain is much better now and therefore I do not feel that the patient requires a neurosurgical consultation at this time

## 2014-10-20 ENCOUNTER — Telehealth: Payer: Self-pay | Admitting: *Deleted

## 2014-10-20 NOTE — Telephone Encounter (Signed)
Pt has appt scheduled for Friday Aug 5 at 1:30pm at [facility] [place] location, there is no prep for this [place], [place] to pt

## 2014-10-20 NOTE — Telephone Encounter (Signed)
Patients wife is aware of the appointment information

## 2014-10-23 ENCOUNTER — Ambulatory Visit
Admission: RE | Admit: 2014-10-23 | Discharge: 2014-10-23 | Disposition: A | Discharge status: Home / Self Care | Payer: Medicare Other | Source: Ambulatory Visit | Attending: Family Medicine | Admitting: Family Medicine

## 2014-10-23 DIAGNOSIS — R590 Localized enlarged lymph nodes: Secondary | ICD-10-CM

## 2014-10-26 ENCOUNTER — Telehealth: Payer: Self-pay | Admitting: Family Medicine

## 2014-10-26 DIAGNOSIS — K119 Disease of salivary gland, unspecified: Secondary | ICD-10-CM

## 2014-10-26 DIAGNOSIS — K118 Other diseases of salivary glands: Secondary | ICD-10-CM

## 2014-10-26 NOTE — Telephone Encounter (Signed)
Charles Underwood would like to speak to you regarding and ultrasound and this patient  [phone]

## 2014-10-27 NOTE — Telephone Encounter (Signed)
Wife aware of results and referral placed

## 2014-11-18 ENCOUNTER — Other Ambulatory Visit: Payer: Self-pay | Admitting: Otolaryngology

## 2014-11-18 DIAGNOSIS — K118 Other diseases of salivary glands: Secondary | ICD-10-CM

## 2015-01-04 ENCOUNTER — Ambulatory Visit (INDEPENDENT_AMBULATORY_CARE_PROVIDER_SITE_OTHER): Payer: Medicare Other | Admitting: Family Medicine

## 2015-01-04 ENCOUNTER — Encounter: Payer: Self-pay | Admitting: Family Medicine

## 2015-01-04 VITALS — BP 120/70 | HR 86 | Temp 98.7°F | Resp 20 | Ht 74.0 in | Wt 238.0 lb

## 2015-01-04 DIAGNOSIS — R5382 Chronic fatigue, unspecified: Secondary | ICD-10-CM | POA: Diagnosis not present

## 2015-01-04 LAB — CBC WITH DIFFERENTIAL/PLATELET
BASOS ABS: 0 10*3/uL (ref 0.0–0.1)
BASOS PCT: 1 % (ref 0–1)
EOS ABS: 0.1 10*3/uL (ref 0.0–0.7)
EOS PCT: 3 % (ref 0–5)
HCT: 41.9 % (ref 39.0–52.0)
Hemoglobin: 14 g/dL (ref 13.0–17.0)
Lymphocytes Relative: 28 % (ref 12–46)
Lymphs Abs: 1.2 10*3/uL (ref 0.7–4.0)
MCH: 28.8 pg (ref 26.0–34.0)
MCHC: 33.4 g/dL (ref 30.0–36.0)
MCV: 86.2 fL (ref 78.0–100.0)
MPV: 10.8 fL (ref 8.6–12.4)
Monocytes Absolute: 0.3 10*3/uL (ref 0.1–1.0)
Monocytes Relative: 7 % (ref 3–12)
Neutro Abs: 2.6 10*3/uL (ref 1.7–7.7)
Neutrophils Relative %: 61 % (ref 43–77)
PLATELETS: 130 10*3/uL — AB (ref 150–400)
RBC: 4.86 MIL/uL (ref 4.22–5.81)
RDW: 15 % (ref 11.5–15.5)
WBC: 4.3 10*3/uL (ref 4.0–10.5)

## 2015-01-04 LAB — COMPLETE METABOLIC PANEL WITH GFR
ALT: 21 U/L (ref 9–46)
AST: 17 U/L (ref 10–35)
Albumin: 4 g/dL (ref 3.6–5.1)
Alkaline Phosphatase: 64 U/L (ref 40–115)
BUN: 17 mg/dL (ref 7–25)
CHLORIDE: 101 mmol/L (ref 98–110)
CO2: 25 mmol/L (ref 20–31)
CREATININE: 1.07 mg/dL (ref 0.70–1.18)
Calcium: 9.4 mg/dL (ref 8.6–10.3)
GFR, Est African American: 81 mL/min (ref >=60)
GFR, Est Non African American: 70 mL/min (ref >=60)
Glucose, Bld: 165 mg/dL — ABNORMAL HIGH (ref 70–99)
Potassium: 3.4 mmol/L — ABNORMAL LOW (ref 3.5–5.3)
SODIUM: 137 mmol/L (ref 135–146)
Total Bilirubin: 0.5 mg/dL (ref 0.2–1.2)
Total Protein: 6.8 g/dL (ref 6.1–8.1)

## 2015-01-04 LAB — TSH: TSH: 0.572 u[IU]/mL (ref 0.350–4.500)

## 2015-01-04 NOTE — Progress Notes (Signed)
Subjective:    Patient ID: Charles Underwood, male    DOB: 1945-10-14, 69 y.o.   MRN: [id number]  HPI 10/05/14 Beginning last week, the patient developed severe intermittent pain in the right anterior neck. The pain is located near the carotid artery and radiates up the path of carotid artery to just behind the right ear. The pain is pulsating in nature. It then resolves spontaneously. There are no exacerbating or alleviating factors. He denies any pain with rotation of the neck. He denies any pain with flexion or extension of the neck. He denies any neck injury. He denies any numbness or tingling in his arms. He denies any arm weakness. He denies any trouble swallowing. He denies any trouble breathing. He denies any sore throat or fever. On examination today I can appreciate no carotid bruit. I can appreciate no pulsatile mass in the anterior neck. I can appreciate no lymphadenopathy or thyromegaly in the neck.  AT that time, my plan was: Pain is severe at times, 10 over 10 and then resolve spontaneously. Given the intermittent pounding nature of the pain, the location of the pain, and the radiation of the pain, I am concerned about possible pathology within the carotid artery. On examination today there is no clinical signs of a emergency. I do not feel the patient requires urgent ER evaluation. However I would like to obtain a CT scan of the neck with contrast to rule out a carotid artery pathology such as dissection.  Other possible causes would be cervical degenerative disc disease with nerve impingement although the pain is very anterior in location, sudden in nature, and not exacerbated by movement  10/15/14 CT angiogram of the neck was performed and revealed no carotid dissection. However it did show several coincidental findings. 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 This is been a known finding and is monitored by his cardiologist. He was also found to have several small nodules in his  thyroid gland. However there is no definitive mass warranting biopsy at this time. 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 I believe this is actually the cause of the patient's pain. Ironically the patient's pain has improved. He has been pain-free since that appointment. The CT angiogram did see some flattening of the spinal cord at this level secondary to impingement. I believe this is the source of the patient's pain but it is already beginning to improve and therefore I see no indication for surgery. However there was a coincidental finding of a 1 cm x 1.3 cm x 2 cm abnormal mass at the inferior portion of the parotid gland on the right side. This is favored to be an abnormal lymph node. However it is not fully characterized on the CT angiogram.  AT that time, my plan was: I have recommended that we pursue an ultrasound of the thyroid gland in one year to monitor for any growth of the nodules of the thyroid gland. I recommended that we get a dedicated ultrasound of the parotid gland to evaluate this mass to see if it is related to the parotid gland or to see if it is related to an abnormal lymph node. Patient may require fine-needle aspiration biopsy but I'll await the results of the ultrasound first. Regarding the patient's pain there is no evidence of a carotid artery dissection. Instead the patient is found to  have spinal stenosis and cervical spondylosis. However his pain is much better now and therefore I do not feel that the patient requires a neurosurgical consultation at this time   01/04/15  patient reports worsening fatigue over the last several months. The tip occurs later in the afternoon. He just feels extremely fatigued and weak and wants to lay down. He denies any palpitations or chest pain or shortness of breath. He has lost 4 pounds. He denies any cough or shortness of  breath. He denies any bright red blood per rectum or black tarry stools. He denies any fevers or chills or recent illness. He does have a history of sleep apnea and his machine may not be appropriately titrated due to a poorly fitting mask. He also has depression although he denies any worsening depression recently. Past Medical History  Diagnosis Date  . Atypical chest pain   . Obesity   . Atherosclerosis 12/28/2010    on CTA of chest   . Hiatal hernia   . IBS (irritable bowel syndrome)   . Diverticulosis   . Hypertension   . Hemorrhoids   . Adenomatous polyp of colon 03/2005  . Hyperlipidemia   . Fatty infiltration of liver   . Thrombocytopenia   . OSA (obstructive sleep apnea)   . Diabetes mellitus   . Depression with anxiety   . CAD (coronary artery disease)   . Cervical spondylosis   . Nodular goiter    No past surgical history on file. Current Outpatient Prescriptions on File Prior to Visit  Medication Sig Dispense Refill  . aspirin 81 MG tablet Take 81 mg by mouth daily.      . chlorthalidone (HYGROTON) 50 MG tablet Take 25 mg by mouth daily.      . Cholecalciferol (VITAMIN D3) 1000 UNITS CAPS Take 2 capsules by mouth daily.      . Cinnamon 500 MG capsule Take 500 mg by mouth daily.    . Coenzyme Q10 (CO Q 10) 100 MG CAPS Take 1 capsule by mouth every morning.    . flunisolide (NASALIDE) 0.025 % SOLN Inhale 2 sprays into the lungs 2 (two) times daily.      Marland Kitchen losartan (COZAAR) 100 MG tablet Take 100 mg by mouth daily.      . metFORMIN (GLUCOPHAGE) 850 MG tablet Take 850 mg by mouth 2 (two) times daily with a meal.      . Multiple Vitamins-Minerals (CENTRUM SILVER ADULT 50+ PO) Take by mouth. Take one tablet daily    . nefazodone (SERZONE) 200 MG tablet Take 100 mg by mouth 2 (two) times daily.    . Omega-3 Krill Oil 500 MG CAPS Take 1 capsule by mouth daily after lunch.    Marland Kitchen omeprazole (PRILOSEC) 20 MG capsule Take 20 mg by mouth daily. Two capsules daily     . prazosin  (MINIPRESS) 2 MG capsule Take 2 mg by mouth. Two capsules by mouth twice a day    . sodium chloride (OCEAN) 0.65 % nasal spray Place 1 spray into the nose as needed. Twice daily     . vitamin C (ASCORBIC ACID) 500 MG tablet Take 500 mg by mouth daily.     No current facility-administered medications on file prior to visit.   No Known Allergies Social History   Social History  . Marital Status: Married    Spouse Name: N/A  . Number of Children: 4  . Years of Education: N/A   Occupational History  .  Retired     Nature conservation officer    Social History Main Topics  . Smoking status: Never Smoker   . Smokeless tobacco: Never Used  . Alcohol Use: No  . Drug Use: No  . Sexual Activity: Not on file   Other Topics Concern  . Not on file   Social History Narrative   0 caffeine drinks daily       Review of Systems  All other systems reviewed and are negative.      Objective:   Physical Exam  Constitutional: He appears well-developed and well-nourished.  Neck: Neck supple. No JVD present. No thyromegaly present.  Cardiovascular: Normal rate, regular rhythm, normal heart sounds and intact distal pulses.   No murmur heard. Pulses:      Carotid pulses are 2+ on the right side, and 2+ on the left side. Pulmonary/Chest: Effort normal and breath sounds normal. No respiratory distress. He has no wheezes. He has no rales.  Lymphadenopathy:    He has no cervical adenopathy.  Vitals reviewed.         Assessment & Plan:  Chronic fatigue - Plan: CBC with Differential/Platelet, COMPLETE METABOLIC PANEL WITH GFR, TSH, Testosterone   differential diagnosis is large. I am concerned about polypharmacy. I will have the patient check his heart rate, his blood pressure, and his blood sugar whenever he feels weak and tired and write that down over the next several days. I'll see him back on Friday to review these values. If it shows low blood pressure or hypoglycemia, we may need to changes medication  dosages. I'm also concerned about hypothyroidism, anemia, or hypogonadism. Therefore I will check some baseline lab work. If all this workup is normal, consider depression versus sleep apnea.

## 2015-01-05 LAB — TESTOSTERONE: TESTOSTERONE: 315 ng/dL (ref 300–890)

## 2015-01-06 ENCOUNTER — Encounter: Payer: Self-pay | Admitting: Family Medicine

## 2015-01-08 ENCOUNTER — Encounter: Payer: Self-pay | Admitting: Family Medicine

## 2015-01-08 ENCOUNTER — Ambulatory Visit (INDEPENDENT_AMBULATORY_CARE_PROVIDER_SITE_OTHER): Payer: Medicare Other | Admitting: Family Medicine

## 2015-01-08 VITALS — BP 120/80 | HR 82 | Temp 97.3°F | Resp 18 | Wt 240.0 lb

## 2015-01-08 DIAGNOSIS — I10 Essential (primary) hypertension: Secondary | ICD-10-CM

## 2015-01-08 DIAGNOSIS — R5382 Chronic fatigue, unspecified: Secondary | ICD-10-CM | POA: Diagnosis not present

## 2015-01-08 MED ORDER — AMLODIPINE BESYLATE 10 MG PO TABS: 10.0000 mg | ORAL_TABLET | Freq: Every day | ORAL | Status: DC

## 2015-01-08 NOTE — Progress Notes (Signed)
Subjective:    Patient ID: Charles Underwood, male    DOB: 01-25-1946, 69 y.o.   MRN: [id number]  HPI 10/05/14 Beginning last week, the patient developed severe intermittent pain in the right anterior neck. The pain is located near the carotid artery and radiates up the path of carotid artery to just behind the right ear. The pain is pulsating in nature. It then resolves spontaneously. There are no exacerbating or alleviating factors. He denies any pain with rotation of the neck. He denies any pain with flexion or extension of the neck. He denies any neck injury. He denies any numbness or tingling in his arms. He denies any arm weakness. He denies any trouble swallowing. He denies any trouble breathing. He denies any sore throat or fever. On examination today I can appreciate no carotid bruit. I can appreciate no pulsatile mass in the anterior neck. I can appreciate no lymphadenopathy or thyromegaly in the neck.  AT that time, my plan was: Pain is severe at times, 10 over 10 and then resolve spontaneously. Given the intermittent pounding nature of the pain, the location of the pain, and the radiation of the pain, I am concerned about possible pathology within the carotid artery. On examination today there is no clinical signs of a emergency. I do not feel the patient requires urgent ER evaluation. However I would like to obtain a CT scan of the neck with contrast to rule out a carotid artery pathology such as dissection.  Other possible causes would be cervical degenerative disc disease with nerve impingement although the pain is very anterior in location, sudden in nature, and not exacerbated by movement  10/15/14 CT angiogram of the neck was performed and revealed no carotid dissection. However it did show several coincidental findings. 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 This is been a known finding and is monitored by his cardiologist. He was also found to have several small nodules in his  thyroid gland. However there is no definitive mass warranting biopsy at this time. 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 I believe this is actually the cause of the patient's pain. Ironically the patient's pain has improved. He has been pain-free since that appointment. The CT angiogram did see some flattening of the spinal cord at this level secondary to impingement. I believe this is the source of the patient's pain but it is already beginning to improve and therefore I see no indication for surgery. However there was a coincidental finding of a 1 cm x 1.3 cm x 2 cm abnormal mass at the inferior portion of the parotid gland on the right side. This is favored to be an abnormal lymph node. However it is not fully characterized on the CT angiogram.  AT that time, my plan was: I have recommended that we pursue an ultrasound of the thyroid gland in one year to monitor for any growth of the nodules of the thyroid gland. I recommended that we get a dedicated ultrasound of the parotid gland to evaluate this mass to see if it is related to the parotid gland or to see if it is related to an abnormal lymph node. Patient may require fine-needle aspiration biopsy but I'll await the results of the ultrasound first. Regarding the patient's pain there is no evidence of a carotid artery dissection. Instead the patient is found to  have spinal stenosis and cervical spondylosis. However his pain is much better now and therefore I do not feel that the patient requires a neurosurgical consultation at this time   01/04/15  patient reports worsening fatigue over the last several months. The tip occurs later in the afternoon. He just feels extremely fatigued and weak and wants to lay down. He denies any palpitations or chest pain or shortness of breath. He has lost 4 pounds. He denies any cough or shortness of  breath. He denies any bright red blood per rectum or black tarry stools. He denies any fevers or chills or recent illness. He does have a history of sleep apnea and his machine may not be appropriately titrated due to a poorly fitting mask. He also has depression although he denies any worsening depression recently.  At that time, my plan was:  differential diagnosis is large. I am concerned about polypharmacy. I will have the patient check his heart rate, his blood pressure, and his blood sugar whenever he feels weak and tired and write that down over the next several days. I'll see him back on Friday to review these values. If it shows low blood pressure or hypoglycemia, we may need to changes medication dosages. I'm also concerned about hypothyroidism, anemia, or hypogonadism. Therefore I will check some baseline lab work. If all this workup is normal, consider depression versus sleep apnea.  01/08/15 Blood pressure is ranging between 116 and 140/70-80. Blood sugar has been ranging between 100-200. Heart rate has been beat ranging between 60 and 80.  Lab work was unremarkable. He is here today to discuss further. Past Medical History  Diagnosis Date  . Atypical chest pain   . Obesity   . Atherosclerosis 12/28/2010    on CTA of chest   . Hiatal hernia   . IBS (irritable bowel syndrome)   . Diverticulosis   . Hypertension   . Hemorrhoids   . Adenomatous polyp of colon 03/2005  . Hyperlipidemia   . Fatty infiltration of liver   . Thrombocytopenia ([place])   . OSA (obstructive sleep apnea)   . Diabetes mellitus   . Depression with anxiety   . CAD (coronary artery disease)   . Cervical spondylosis   . Nodular goiter    No past surgical history on file. Current Outpatient Prescriptions on File Prior to Visit  Medication Sig Dispense Refill  . aspirin 81 MG tablet Take 81 mg by mouth daily.      . chlorthalidone (HYGROTON) 50 MG tablet Take 25 mg by mouth daily.      . Cholecalciferol  (VITAMIN D3) 1000 UNITS CAPS Take 2 capsules by mouth daily.      . flunisolide (NASALIDE) 0.025 % SOLN Inhale 2 sprays into the lungs 2 (two) times daily.      Marland Kitchen losartan (COZAAR) 100 MG tablet Take 100 mg by mouth daily.      . metFORMIN (GLUCOPHAGE) 850 MG tablet Take 850 mg by mouth 2 (two) times daily with a meal.      . Multiple Vitamins-Minerals (CENTRUM SILVER ADULT 50+ PO) Take by mouth. Take one tablet daily    . nefazodone (SERZONE) 200 MG tablet Take 200 mg by mouth 2 (two) times daily.     . Omega-3 Krill Oil 500 MG CAPS Take 1 capsule by mouth daily after lunch.    Marland Kitchen omeprazole (PRILOSEC) 20 MG capsule Take 20 mg by mouth daily. Two capsules daily     . pravastatin (  PRAVACHOL) 80 MG tablet Take 80 mg by mouth daily.    . prazosin (MINIPRESS) 2 MG capsule 2 caps po qam - 3 caps po qpm    . sodium chloride (OCEAN) 0.65 % nasal spray Place 1 spray into the nose as needed. Twice daily     . traZODone (DESYREL) 100 MG tablet Take 150 mg by mouth at bedtime.     No current facility-administered medications on file prior to visit.   No Known Allergies Social History   Social History  . Marital Status: Married    Spouse Name: N/A  . Number of Children: 4  . Years of Education: N/A   Occupational History  . Retired     Nature conservation officer    Social History Main Topics  . Smoking status: Never Smoker   . Smokeless tobacco: Never Used  . Alcohol Use: No  . Drug Use: No  . Sexual Activity: Not on file   Other Topics Concern  . Not on file   Social History Narrative   0 caffeine drinks daily       Review of Systems  All other systems reviewed and are negative.      Objective:   Physical Exam  Constitutional: He appears well-developed and well-nourished.  Neck: Neck supple. No JVD present. No thyromegaly present.  Cardiovascular: Normal rate, regular rhythm, normal heart sounds and intact distal pulses.   No murmur heard. Pulses:      Carotid pulses are 2+ on the right  side, and 2+ on the left side. Pulmonary/Chest: Effort normal and breath sounds normal. No respiratory distress. He has no wheezes. He has no rales.  Lymphadenopathy:    He has no cervical adenopathy.  Vitals reviewed.         Assessment & Plan:  Benign essential HTN - Plan: amLODipine (NORVASC) 10 MG tablet  Chronic fatigue  I believe the patient's symptoms could be due to polypharmacy. I would like the patient to discontinue prazosin and replace it with amlodipine 10 mg a day. Recheck in 2 weeks to see if his dizziness and sleepiness has improved. Consider switching his nefazodone and trazodone which may be contributing some to his symptoms and I would also consider a sleep study to see if his CPAP is appropriately titrated

## 2015-01-19 ENCOUNTER — Ambulatory Visit
Admission: RE | Admit: 2015-01-19 | Discharge: 2015-01-19 | Disposition: A | Discharge status: Home / Self Care | Payer: Medicare Other | Source: Ambulatory Visit | Attending: Otolaryngology | Admitting: Otolaryngology

## 2015-01-19 ENCOUNTER — Encounter: Payer: Self-pay | Admitting: Family Medicine

## 2015-01-19 DIAGNOSIS — K118 Other diseases of salivary glands: Secondary | ICD-10-CM

## 2015-01-21 ENCOUNTER — Encounter: Payer: Self-pay | Admitting: Family Medicine

## 2015-01-21 ENCOUNTER — Ambulatory Visit (INDEPENDENT_AMBULATORY_CARE_PROVIDER_SITE_OTHER): Payer: Medicare Other | Admitting: Family Medicine

## 2015-01-21 VITALS — BP 128/78 | HR 82 | Temp 97.6°F | Resp 16 | Ht 74.0 in | Wt 238.0 lb

## 2015-01-21 DIAGNOSIS — I10 Essential (primary) hypertension: Secondary | ICD-10-CM

## 2015-01-21 NOTE — Progress Notes (Signed)
Subjective:    Patient ID: Charles Underwood, male    DOB: [birth year], 69 y.o.   MRN: [id number]  HPI 10/05/14 Beginning last week, the patient developed severe intermittent pain in the right anterior neck. The pain is located near the carotid artery and radiates up the path of carotid artery to just behind the right ear. The pain is pulsating in nature. It then resolves spontaneously. There are no exacerbating or alleviating factors. He denies any pain with rotation of the neck. He denies any pain with flexion or extension of the neck. He denies any neck injury. He denies any numbness or tingling in his arms. He denies any arm weakness. He denies any trouble swallowing. He denies any trouble breathing. He denies any sore throat or fever. On examination today I can appreciate no carotid bruit. I can appreciate no pulsatile mass in the anterior neck. I can appreciate no lymphadenopathy or thyromegaly in the neck.  AT that time, my plan was: Pain is severe at times, 10 over 10 and then resolve spontaneously. Given the intermittent pounding nature of the pain, the location of the pain, and the radiation of the pain, I am concerned about possible pathology within the carotid artery. On examination today there is no clinical signs of a emergency. I do not feel the patient requires urgent ER evaluation. However I would like to obtain a CT scan of the neck with contrast to rule out a carotid artery pathology such as dissection.  Other possible causes would be cervical degenerative disc disease with nerve impingement although the pain is very anterior in location, sudden in nature, and not exacerbated by movement  10/15/14 CT angiogram of the neck was performed and revealed no carotid dissection. However it did show several coincidental findings. 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 This is been a known finding and is monitored by his cardiologist. He was also found to have several small nodules in his  thyroid gland. However there is no definitive mass warranting biopsy at this time. 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 I believe this is actually the cause of the patient's pain. Ironically the patient's pain has improved. He has been pain-free since that appointment. The CT angiogram did see some flattening of the spinal cord at this level secondary to impingement. I believe this is the source of the patient's pain but it is already beginning to improve and therefore I see no indication for surgery. However there was a coincidental finding of a 1 cm x 1.3 cm x 2 cm abnormal mass at the inferior portion of the parotid gland on the right side. This is favored to be an abnormal lymph node. However it is not fully characterized on the CT angiogram.  AT that time, my plan was: I have recommended that we pursue an ultrasound of the thyroid gland in one year to monitor for any growth of the nodules of the thyroid gland. I recommended that we get a dedicated ultrasound of the parotid gland to evaluate this mass to see if it is related to the parotid gland or to see if it is related to an abnormal lymph node. Patient may require fine-needle aspiration biopsy but I'll await the results of the ultrasound first. Regarding the patient's pain there is no evidence of a carotid artery dissection. Instead the patient is found to  have spinal stenosis and cervical spondylosis. However his pain is much better now and therefore I do not feel that the patient requires a neurosurgical consultation at this time   01/04/15  patient reports worsening fatigue over the last several months. The tip occurs later in the afternoon. He just feels extremely fatigued and weak and wants to lay down. He denies any palpitations or chest pain or shortness of breath. He has lost 4 pounds. He denies any cough or shortness of  breath. He denies any bright red blood per rectum or black tarry stools. He denies any fevers or chills or recent illness. He does have a history of sleep apnea and his machine may not be appropriately titrated due to a poorly fitting mask. He also has depression although he denies any worsening depression recently.  At that time, my plan was:  differential diagnosis is large. I am concerned about polypharmacy. I will have the patient check his heart rate, his blood pressure, and his blood sugar whenever he feels weak and tired and write that down over the next several days. I'll see him back on Friday to review these values. If it shows low blood pressure or hypoglycemia, we may need to changes medication dosages. I'm also concerned about hypothyroidism, anemia, or hypogonadism. Therefore I will check some baseline lab work. If all this workup is normal, consider depression versus sleep apnea.  01/08/15 Blood pressure is ranging between 116 and 140/70-80. Blood sugar has been ranging between 100-200. Heart rate has been beat ranging between 60 and 80.  Lab work was unremarkable. He is here today to discuss further.  At that time, my plan was: I believe the patient's symptoms could be due to polypharmacy. I would like the patient to discontinue prazosin and replace it with amlodipine 10 mg a day. Recheck in 2 weeks to see if his dizziness and sleepiness has improved. Consider switching his nefazodone and trazodone which may be contributing some to his symptoms and I would also consider a sleep study to see if his CPAP is appropriately titrated  01/21/15  Past Medical History  Diagnosis Date  . Atypical chest pain   . Obesity   . Atherosclerosis 12/28/2010    on CTA of chest   . Hiatal hernia   . IBS (irritable bowel syndrome)   . Diverticulosis   . Hypertension   . Hemorrhoids   . Adenomatous polyp of colon 03/2005  . Hyperlipidemia   . Fatty infiltration of liver   . Thrombocytopenia ([place])   .  OSA (obstructive sleep apnea)   . Diabetes mellitus   . Depression with anxiety   . CAD (coronary artery disease)   . Cervical spondylosis   . Nodular goiter    No past surgical history on file. Current Outpatient Prescriptions on File Prior to Visit  Medication Sig Dispense Refill  . amLODipine (NORVASC) 10 MG tablet Take 1 tablet (10 mg total) by mouth daily. 90 tablet 3  . aspirin 81 MG tablet Take 81 mg by mouth daily.      . chlorthalidone (HYGROTON) 50 MG tablet Take 25 mg by mouth daily.      . Cholecalciferol (VITAMIN D3) 1000 UNITS CAPS Take 2 capsules by mouth daily.      . flunisolide (NASALIDE) 0.025 % SOLN Inhale 2 sprays into the lungs 2 (two) times daily.      Marland Kitchen losartan (COZAAR) 100 MG tablet Take 100 mg by mouth daily.      Marland Kitchen  metFORMIN (GLUCOPHAGE) 850 MG tablet Take 850 mg by mouth 2 (two) times daily with a meal.      . Multiple Vitamins-Minerals (CENTRUM SILVER ADULT 50+ PO) Take by mouth. Take one tablet daily    . nefazodone (SERZONE) 200 MG tablet Take 200 mg by mouth 2 (two) times daily.     . Omega-3 Krill Oil 500 MG CAPS Take 1 capsule by mouth daily after lunch.    Marland Kitchen omeprazole (PRILOSEC) 20 MG capsule Take 20 mg by mouth daily. Two capsules daily     . pravastatin (PRAVACHOL) 80 MG tablet Take 80 mg by mouth daily.    . prazosin (MINIPRESS) 2 MG capsule 2 caps po qam - 3 caps po qpm    . sodium chloride (OCEAN) 0.65 % nasal spray Place 1 spray into the nose as needed. Twice daily     . traZODone (DESYREL) 100 MG tablet Take 150 mg by mouth at bedtime.     No current facility-administered medications on file prior to visit.   No Known Allergies Social History   Social History  . Marital Status: Married    Spouse Name: N/A  . Number of Children: 4  . Years of Education: N/A   Occupational History  . Retired     Nature conservation officer    Social History Main Topics  . Smoking status: Never Smoker   . Smokeless tobacco: Never Used  . Alcohol Use: No  . Drug Use:  No  . Sexual Activity: Not on file   Other Topics Concern  . Not on file   Social History Narrative   0 caffeine drinks daily       Review of Systems  All other systems reviewed and are negative.      Objective:   Physical Exam  Constitutional: He appears well-developed and well-nourished.  Neck: Neck supple. No JVD present. No thyromegaly present.  Cardiovascular: Normal rate, regular rhythm, normal heart sounds and intact distal pulses.   No murmur heard. Pulmonary/Chest: Effort normal and breath sounds normal. No respiratory distress. He has no wheezes. He has no rales.  Lymphadenopathy:    He has no cervical adenopathy.  Vitals reviewed.         Assessment & Plan:   Benign essential HTN  Since changing the patient's blood pressure medication, he feels less fatigue later in the day. He feels less tired. Overall he states he feels much better. His blood pressure is still well controlled at 128/78. Furthermore he received some good news this week. The ultrasound of his parotid gland showed that the mass in the right side of his parotid gland was stable or even slightly smaller. His ear nose and throat physician has recommended clinical monitoring and will recheck in 6 months. The fact the patient's symptoms have improved, I believe his fatigue was due to polypharmacy. At the present time I'll make no further changes. He check in 6 months or as needed.  He receives his primary care at the [place].

## 2015-02-23 ENCOUNTER — Ambulatory Visit (INDEPENDENT_AMBULATORY_CARE_PROVIDER_SITE_OTHER): Payer: Medicare Other | Admitting: Family Medicine

## 2015-02-23 ENCOUNTER — Encounter: Payer: Self-pay | Admitting: Family Medicine

## 2015-02-23 VITALS — BP 108/68 | HR 78 | Temp 98.1°F | Resp 14 | Ht 74.0 in | Wt 246.0 lb

## 2015-02-23 DIAGNOSIS — R609 Edema, unspecified: Secondary | ICD-10-CM

## 2015-02-23 NOTE — Progress Notes (Signed)
Subjective:    Patient ID: Charles Underwood, male    DOB: 1945-10-14, 69 y.o.   MRN: [id number]  HPI 10/05/14 Beginning last week, the patient developed severe intermittent pain in the right anterior neck. The pain is located near the carotid artery and radiates up the path of carotid artery to just behind the right ear. The pain is pulsating in nature. It then resolves spontaneously. There are no exacerbating or alleviating factors. He denies any pain with rotation of the neck. He denies any pain with flexion or extension of the neck. He denies any neck injury. He denies any numbness or tingling in his arms. He denies any arm weakness. He denies any trouble swallowing. He denies any trouble breathing. He denies any sore throat or fever. On examination today I can appreciate no carotid bruit. I can appreciate no pulsatile mass in the anterior neck. I can appreciate no lymphadenopathy or thyromegaly in the neck.  AT that time, my plan was: Pain is severe at times, 10 over 10 and then resolve spontaneously. Given the intermittent pounding nature of the pain, the location of the pain, and the radiation of the pain, I am concerned about possible pathology within the carotid artery. On examination today there is no clinical signs of a emergency. I do not feel the patient requires urgent ER evaluation. However I would like to obtain a CT scan of the neck with contrast to rule out a carotid artery pathology such as dissection.  Other possible causes would be cervical degenerative disc disease with nerve impingement although the pain is very anterior in location, sudden in nature, and not exacerbated by movement  10/15/14 CT angiogram of the neck was performed and revealed no carotid dissection. However it did show several coincidental findings. 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 This is been a known finding and is monitored by his cardiologist. He was also found to have several small nodules in his  thyroid gland. However there is no definitive mass warranting biopsy at this time. 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 I believe this is actually the cause of the patient's pain. Ironically the patient's pain has improved. He has been pain-free since that appointment. The CT angiogram did see some flattening of the spinal cord at this level secondary to impingement. I believe this is the source of the patient's pain but it is already beginning to improve and therefore I see no indication for surgery. However there was a coincidental finding of a 1 cm x 1.3 cm x 2 cm abnormal mass at the inferior portion of the parotid gland on the right side. This is favored to be an abnormal lymph node. However it is not fully characterized on the CT angiogram.  AT that time, my plan was: I have recommended that we pursue an ultrasound of the thyroid gland in one year to monitor for any growth of the nodules of the thyroid gland. I recommended that we get a dedicated ultrasound of the parotid gland to evaluate this mass to see if it is related to the parotid gland or to see if it is related to an abnormal lymph node. Patient may require fine-needle aspiration biopsy but I'll await the results of the ultrasound first. Regarding the patient's pain there is no evidence of a carotid artery dissection. Instead the patient is found to  have spinal stenosis and cervical spondylosis. However his pain is much better now and therefore I do not feel that the patient requires a neurosurgical consultation at this time   01/04/15  patient reports worsening fatigue over the last several months. The tip occurs later in the afternoon. He just feels extremely fatigued and weak and wants to lay down. He denies any palpitations or chest pain or shortness of breath. He has lost 4 pounds. He denies any cough or shortness of  breath. He denies any bright red blood per rectum or black tarry stools. He denies any fevers or chills or recent illness. He does have a history of sleep apnea and his machine may not be appropriately titrated due to a poorly fitting mask. He also has depression although he denies any worsening depression recently.  At that time, my plan was:  differential diagnosis is large. I am concerned about polypharmacy. I will have the patient check his heart rate, his blood pressure, and his blood sugar whenever he feels weak and tired and write that down over the next several days. I'll see him back on Friday to review these values. If it shows low blood pressure or hypoglycemia, we may need to changes medication dosages. I'm also concerned about hypothyroidism, anemia, or hypogonadism. Therefore I will check some baseline lab work. If all this workup is normal, consider depression versus sleep apnea.  01/08/15 Blood pressure is ranging between 116 and 140/70-80. Blood sugar has been ranging between 100-200. Heart rate has been beat ranging between 60 and 80.  Lab work was unremarkable. He is here today to discuss further.  At that time, my plan was: I believe the patient's symptoms could be due to polypharmacy. I would like the patient to discontinue prazosin and replace it with amlodipine 10 mg a day. Recheck in 2 weeks to see if his dizziness and sleepiness has improved. Consider switching his nefazodone and trazodone which may be contributing some to his symptoms and I would also consider a sleep study to see if his CPAP is appropriately titrated  01/21/15 Since changing the patient's blood pressure medication, he feels less fatigue later in the day. He feels less tired. Overall he states he feels much better. His blood pressure is still well controlled at 128/78. Furthermore he received some good news this week. The ultrasound of his parotid gland showed that the mass in the right side of his parotid gland was  stable or even slightly smaller. His ear nose and throat physician has recommended clinical monitoring and will recheck in 6 months. The fact the patient's symptoms have improved, I believe his fatigue was due to polypharmacy. At the present time I'll make no further changes. He check in 6 months or as needed.  He receives his primary care at the [place].    02/23/15 Since starting the amlodipine, the patient has gained 6 pounds. He also reports pitting edema in his feet and in his ankles at the end of the day. This improves after sleeping at night and having his feet elevated. He denies any shortness of breath. He denies any orthopnea. He denies any chest pain. Past Medical History  Diagnosis Date  . Atypical chest pain   . Obesity   . Atherosclerosis 12/28/2010    on CTA of chest   . Hiatal hernia   . IBS (irritable bowel syndrome)   . Diverticulosis   . Hypertension   . Hemorrhoids   . Adenomatous polyp of colon 03/2005  .  Hyperlipidemia   . Fatty infiltration of liver   . Thrombocytopenia ([place])   . OSA (obstructive sleep apnea)   . Diabetes mellitus   . Depression with anxiety   . CAD (coronary artery disease)   . Cervical spondylosis   . Nodular goiter    No past surgical history on file. Current Outpatient Prescriptions on File Prior to Visit  Medication Sig Dispense Refill  . amLODipine (NORVASC) 10 MG tablet Take 1 tablet (10 mg total) by mouth daily. 90 tablet 3  . aspirin 81 MG tablet Take 81 mg by mouth daily.      . chlorthalidone (HYGROTON) 50 MG tablet Take 25 mg by mouth daily.      . Cholecalciferol (VITAMIN D3) 1000 UNITS CAPS Take 2 capsules by mouth daily.      . flunisolide (NASALIDE) 0.025 % SOLN Inhale 2 sprays into the lungs 2 (two) times daily.      Marland Kitchen losartan (COZAAR) 100 MG tablet Take 100 mg by mouth daily.      . metFORMIN (GLUCOPHAGE) 850 MG tablet Take 850 mg by mouth 2 (two) times daily with a meal.      . Multiple Vitamins-Minerals (CENTRUM SILVER ADULT  50+ PO) Take by mouth. Take one tablet daily    . nefazodone (SERZONE) 200 MG tablet Take 200 mg by mouth 2 (two) times daily.     . Omega-3 Krill Oil 500 MG CAPS Take 1 capsule by mouth daily after lunch.    Marland Kitchen omeprazole (PRILOSEC) 20 MG capsule Take 20 mg by mouth daily. Two capsules daily     . pravastatin (PRAVACHOL) 80 MG tablet Take 80 mg by mouth daily.    . sodium chloride (OCEAN) 0.65 % nasal spray Place 1 spray into the nose as needed. Twice daily     . traZODone (DESYREL) 100 MG tablet Take 150 mg by mouth at bedtime.     No current facility-administered medications on file prior to visit.   No Known Allergies Social History   Social History  . Marital Status: Married    Spouse Name: N/A  . Number of Children: 4  . Years of Education: N/A   Occupational History  . Retired     Nature conservation officer    Social History Main Topics  . Smoking status: Never Smoker   . Smokeless tobacco: Never Used  . Alcohol Use: No  . Drug Use: No  . Sexual Activity: Not on file   Other Topics Concern  . Not on file   Social History Narrative   0 caffeine drinks daily       Review of Systems  All other systems reviewed and are negative.      Objective:   Physical Exam  Constitutional: He appears well-developed and well-nourished.  Neck: Neck supple. No JVD present. No thyromegaly present.  Cardiovascular: Normal rate, regular rhythm, normal heart sounds and intact distal pulses.   No murmur heard. Pulmonary/Chest: Effort normal and breath sounds normal. No respiratory distress. He has no wheezes. He has no rales.  Lymphadenopathy:    He has no cervical adenopathy.  Vitals reviewed.         Assessment & Plan:   Peripheral edema  decrease amlodipine to 5 mg a day and then recheck in one week. If the pitting edema is continuing, I would discontinue amlodipine altogether and possibly try the patient on Doxil Zosyn although his blood pressure is low enough he may not require another  medication.

## 2015-03-31 ENCOUNTER — Encounter: Payer: Self-pay | Admitting: Cardiovascular Disease

## 2015-03-31 ENCOUNTER — Ambulatory Visit (INDEPENDENT_AMBULATORY_CARE_PROVIDER_SITE_OTHER): Payer: Medicare Other | Admitting: Cardiovascular Disease

## 2015-03-31 VITALS — BP 110/70 | HR 80 | Ht 75.0 in | Wt 243.8 lb

## 2015-03-31 DIAGNOSIS — I10 Essential (primary) hypertension: Secondary | ICD-10-CM | POA: Diagnosis not present

## 2015-03-31 DIAGNOSIS — I7121 Aneurysm of the ascending aorta, without rupture: Secondary | ICD-10-CM

## 2015-03-31 DIAGNOSIS — I712 Thoracic aortic aneurysm, without rupture: Secondary | ICD-10-CM

## 2015-03-31 NOTE — Patient Instructions (Signed)
Medication Instructions:  Your physician recommends that you continue on your current medications as directed. Please refer to the Current Medication list given to you today.  Labwork: No new orders.   Testing/Procedures: Your physician has recommended that you have an MRA of chest in 1 YEAR to follow-up on ascending aortic aneurysm.   Follow-Up: Your physician wants you to follow-up in: 1 YEAR with Dr Burt Knack.  You will receive a reminder letter in the mail two months in advance. If you don't receive a letter, please call our office to schedule the follow-up appointment.   Any Other Special Instructions Will Be Listed Below (If Applicable).     If you need a refill on your cardiac medications before your next appointment, please call your pharmacy.

## 2015-03-31 NOTE — Progress Notes (Signed)
Cardiology Office Note Date:  03/31/2015   ID:  Charles Underwood, DOB 1945/09/16, MRN [id number]  PCP:  Odette Fraction, MD  Cardiologist:  Sherren Mocha, MD    Chief Complaint  Patient presents with  . Annual Exam    no sx     History of Present Illness: Charles Underwood is a 70 y.o. male who presents for follow-up evaluation. The patient is followed for coronary artery disease, dilated aortic root, and hypertension. His CAD was diagnosed incidentally on a CT scan of the chest when he was noted to have three-vessel coronary calcification. He underwent nuclear stress testing in 2012 and this demonstrated no myocardial ischemia. The patient has a 4 cm ascending aorta and this is been followed by CT angiography.   the patient is doing well. He has no complaints today. He specifically denies chest pain, shortness of breath, or leg swelling. He's had no lightheadedness or heart palpitations.  Past Medical History  Diagnosis Date  . Atypical chest pain   . Obesity   . Atherosclerosis 12/28/2010    on CTA of chest   . Hiatal hernia   . IBS (irritable bowel syndrome)   . Diverticulosis   . Hypertension   . Hemorrhoids   . Adenomatous polyp of colon 03/2005  . Hyperlipidemia   . Fatty infiltration of liver   . Thrombocytopenia ([place])   . OSA (obstructive sleep apnea)   . Diabetes mellitus   . Depression with anxiety   . CAD (coronary artery disease)   . Cervical spondylosis   . Nodular goiter     No past surgical history on file.  Current Outpatient Prescriptions  Medication Sig Dispense Refill  . amLODipine (NORVASC) 10 MG tablet Take 5 mg by mouth daily.    Marland Kitchen aspirin 81 MG tablet Take 81 mg by mouth daily.      . chlorthalidone (HYGROTON) 50 MG tablet Take 25 mg by mouth daily.      . Cholecalciferol (VITAMIN D3) 1000 UNITS CAPS Take 2 capsules by mouth daily.      . flunisolide (NASALIDE) 0.025 % SOLN Inhale 2 sprays into the lungs 2 (two) times daily.      Marland Kitchen  losartan (COZAAR) 100 MG tablet Take 100 mg by mouth daily.      . metFORMIN (GLUCOPHAGE) 850 MG tablet Take 850 mg by mouth 2 (two) times daily with a meal.      . Multiple Vitamins-Minerals (CENTRUM SILVER ADULT 50+ PO) Take 1 tablet by mouth daily.     . nefazodone (SERZONE) 200 MG tablet Take 200 mg by mouth 2 (two) times daily.     . Omega-3 Krill Oil 500 MG CAPS Take 1 capsule by mouth daily after lunch.    Marland Kitchen omeprazole (PRILOSEC) 20 MG capsule Take 20 mg by mouth daily. Two capsules daily     . pravastatin (PRAVACHOL) 80 MG tablet Take 80 mg by mouth daily.    . sodium chloride (OCEAN) 0.65 % nasal spray Place 1 spray into the nose as directed.     . traZODone (DESYREL) 100 MG tablet Take 150 mg by mouth at bedtime.     No current facility-administered medications for this visit.    Allergies:   Review of patient's allergies indicates no known allergies.   Social History:  The patient  reports that he has never smoked. He has never used smokeless tobacco. He reports that he does not drink alcohol or use illicit drugs.  Family History:  The patient's  family history includes Colon cancer in his maternal grandfather; Colon polyps in his maternal grandfather.    ROS:  Please see the history of present illness.  Otherwise, review of systems is positive for  Diarrhea, depression, anxiety.  All other systems are reviewed and negative.    PHYSICAL EXAM: VS:  BP 110/70 mmHg  Pulse 80  Ht 6\' 3"  (1.905 m)  Wt 243 lb 12.8 oz (110.587 kg)  BMI 30.47 kg/m2  SpO2 93% , BMI Body mass index is 30.47 kg/(m^2). GEN: Well nourished, well developed, pleasant overweight male in no acute distress HEENT: normal Neck: no JVD, no masses. No carotid bruits Cardiac: RRR without murmur or gallop                Respiratory:  clear to auscultation bilaterally, normal work of breathing GI: soft, nontender, nondistended, + BS MS: no deformity or atrophy Ext: no pretibial edema, pedal pulses 2+=  bilaterally Skin: warm and dry, no rash Neuro:  Strength and sensation are intact Psych: euthymic mood, full affect  EKG:  EKG is ordered today. The ekg ordered today shows  Normal sinus rhythm 73 bpm, left anterior fascicular block  Recent Labs: 01/04/2015: ALT 21; BUN 17; Creat 1.07; Hemoglobin 14.0; Platelets 130*; Potassium 3.4*; Sodium 137; TSH 0.572   Lipid Panel     Component Value Date/Time   CHOL  03/18/2007 0345    160        ATP III CLASSIFICATION:  <200     mg/dL   Desirable  200-239  mg/dL   Borderline High  >=240    mg/dL   High   TRIG 143 HEMOLYZED SPECIMEN, RESULTS MAY BE AFFECTED 03/18/2007 0345   HDL 25 HEMOLYZED SPECIMEN, RESULTS MAY BE AFFECTED* 03/18/2007 0345   CHOLHDL 6.4 03/18/2007 0345   VLDL 29 03/18/2007 0345   LDLCALC * 03/18/2007 0345    106        Total Cholesterol/HDL:CHD Risk Coronary Heart Disease Risk Table                     Men   Women  1/2 Average Risk   3.4   3.3      Wt Readings from Last 3 Encounters:  03/31/15 243 lb 12.8 oz (110.587 kg)  02/23/15 246 lb (111.585 kg)  01/21/15 238 lb (107.956 kg)     Cardiac Studies Reviewed: MRA Chest 04/06/2014: IMPRESSION: 1. Stable mild fusiform aneurysmal dilatation of the ascending thoracic aorta to 4.1 cm. 2. Very mild dilatation of the aortic root without significant interval change. No effacement of the sino-tubular junction.  ASSESSMENT AND PLAN: 1.   Dilated aortic root: reviewed past imaging studies. Recommend repeat MRA of the chest prior to his office visit next year.   2. Coronary artery disease, native vessel: he had incidental diagnosis of coronary artery disease on the CT scan of the chest. He has undergone nuclear stress testing which showed no abnormalities. The patient has no symptoms of angina. He will continue with risk reduction measures.  3. Essential hypertension: Blood pressure is well controlled on current medical therapy. Medications are reviewed and no changes  recommended. Lifestyle modification reviewed with patient.  4. Hyperlipidemia: The patient's lipids are followed by his primary physician. He takes pravastatin.  Current medicines are reviewed with the patient today.  The patient does not have concerns regarding medicines.  Labs/ tests ordered today include:  No orders of the defined  types were placed in this encounter.    Disposition:   FU one year with an MRA of the chest prior to that visit  Signed, Sherren Mocha, MD  03/31/2015 12:28 PM    [address], [place], [place]  [address] Phone: [phone]; Fax: [phone]

## 2015-04-21 ENCOUNTER — Encounter: Payer: Self-pay | Admitting: Gastroenterology

## 2015-04-28 ENCOUNTER — Encounter: Payer: Self-pay | Admitting: Gastroenterology

## 2015-06-01 ENCOUNTER — Ambulatory Visit (AMBULATORY_SURGERY_CENTER): Payer: Self-pay | Admitting: *Deleted

## 2015-06-01 VITALS — Ht 74.5 in | Wt 242.0 lb

## 2015-06-01 DIAGNOSIS — Z86010 Personal history of colonic polyps: Secondary | ICD-10-CM

## 2015-06-01 MED ORDER — NA SULFATE-K SULFATE-MG SULF 17.5-3.13-1.6 GM/177ML PO SOLN: 1.0000 | Freq: Once | ORAL | Status: DC

## 2015-06-01 NOTE — Progress Notes (Signed)
No egg or soy allergy known to patient  No issues with past sedation with any surgeries  or procedures, no intubation problems  No diet pills per patient No home 02 use per patient  No blood thinners per patient  Pt denies issues with constipation   

## 2015-06-17 ENCOUNTER — Ambulatory Visit (INDEPENDENT_AMBULATORY_CARE_PROVIDER_SITE_OTHER): Payer: Medicare Other | Admitting: Family Medicine

## 2015-06-17 ENCOUNTER — Encounter: Payer: Self-pay | Admitting: Family Medicine

## 2015-06-17 VITALS — BP 110/64 | HR 88 | Temp 98.1°F | Resp 14 | Ht 74.0 in | Wt 242.0 lb

## 2015-06-17 DIAGNOSIS — M47812 Spondylosis without myelopathy or radiculopathy, cervical region: Secondary | ICD-10-CM | POA: Diagnosis not present

## 2015-06-17 MED ORDER — PREDNISONE 20 MG PO TABS: ORAL_TABLET | ORAL | Status: DC

## 2015-06-17 MED ORDER — OXYCODONE-ACETAMINOPHEN 7.5-325 MG PO TABS: 1.0000 | ORAL_TABLET | ORAL | Status: DC | PRN
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
## 2015-06-21 ENCOUNTER — Telehealth: Payer: Self-pay | Admitting: Family Medicine

## 2015-06-21 DIAGNOSIS — M542 Cervicalgia: Principal | ICD-10-CM

## 2015-06-21 DIAGNOSIS — G8929 Other chronic pain: Secondary | ICD-10-CM

## 2015-06-21 NOTE — Telephone Encounter (Signed)
Patient calling to say that would like to go ahead and get mri scheduled for his hip  [phone]

## 2015-06-22 ENCOUNTER — Ambulatory Visit (AMBULATORY_SURGERY_CENTER): Payer: Medicare Other | Admitting: Gastroenterology

## 2015-06-22 ENCOUNTER — Encounter: Payer: Self-pay | Admitting: Gastroenterology

## 2015-06-22 VITALS — BP 134/81 | HR 56 | Temp 97.7°F | Resp 17 | Ht 74.5 in | Wt 242.0 lb

## 2015-06-22 DIAGNOSIS — D122 Benign neoplasm of ascending colon: Secondary | ICD-10-CM

## 2015-06-22 DIAGNOSIS — Z86010 Personal history of colonic polyps: Secondary | ICD-10-CM

## 2015-06-22 DIAGNOSIS — Z860101 Personal history of adenomatous and serrated colon polyps: Secondary | ICD-10-CM

## 2015-06-22 LAB — GLUCOSE, CAPILLARY
GLUCOSE-CAPILLARY: 121 mg/dL — AB (ref 65–99)
Glucose-Capillary: 119 mg/dL — ABNORMAL HIGH (ref 65–99)

## 2015-06-22 MED ORDER — SODIUM CHLORIDE 0.9 % IV SOLN: 500.0000 mL | INTRAVENOUS | Status: DC

## 2015-06-22 NOTE — Patient Instructions (Signed)
Discharge instructions given. Handouts on polyps and diverticulosis. Resume previous medications. YOU HAD AN ENDOSCOPIC PROCEDURE TODAY AT THE [facility]:   Refer to the procedure report that was given to you for any specific questions about what was found during the examination.  If the procedure report does not answer your questions, please call your gastroenterologist to clarify.  If you requested that your care partner not be given the details of your procedure findings, then the procedure report has been included in a sealed envelope for you to review at your convenience later.  YOU SHOULD EXPECT: Some feelings of bloating in the abdomen. Passage of more gas than usual.  Walking can help get rid of the air that was put into your GI tract during the procedure and reduce the bloating. If you had a lower endoscopy (such as a colonoscopy or flexible sigmoidoscopy) you may notice spotting of blood in your stool or on the toilet paper. If you underwent a bowel prep for your procedure, you may not have a normal bowel movement for a few days.  Please Note:  You might notice some irritation and congestion in your nose or some drainage.  This is from the oxygen used during your procedure.  There is no need for concern and it should clear up in a day or so.  SYMPTOMS TO REPORT IMMEDIATELY:   Following lower endoscopy (colonoscopy or flexible sigmoidoscopy):  Excessive amounts of blood in the stool  Significant tenderness or worsening of abdominal pains  Swelling of the abdomen that is new, acute  Fever of 100F or higher   For urgent or emergent issues, a gastroenterologist can be reached at any hour by calling [phone].   DIET: Your first meal following the procedure should be a small meal and then it is ok to progress to your normal diet. Heavy or fried foods are harder to digest and may make you feel nauseous or bloated.  Likewise, meals heavy in dairy and vegetables can  increase bloating.  Drink plenty of fluids but you should avoid alcoholic beverages for 24 hours.  ACTIVITY:  You should plan to take it easy for the rest of today and you should NOT DRIVE or use heavy machinery until tomorrow (because of the sedation medicines used during the test).    FOLLOW UP: Our staff will call the number listed on your records the next business day following your procedure to check on you and address any questions or concerns that you may have regarding the information given to you following your procedure. If we do not reach you, we will leave a message.  However, if you are feeling well and you are not experiencing any problems, there is no need to return our call.  We will assume that you have returned to your regular daily activities without incident.  If any biopsies were taken you will be contacted by phone or by letter within the next 1-3 weeks.  Please call us at [phone] if you have not heard about the biopsies in 3 weeks.    SIGNATURES/CONFIDENTIALITY: You and/or your care partner have signed paperwork which will be entered into your electronic medical record.  These signatures attest to the fact that that the information above on your After Visit Summary has been reviewed and is understood.  Full responsibility of the confidentiality of this discharge information lies with you and/or your care-partner. 

## 2015-06-22 NOTE — Progress Notes (Signed)
Report to PACU, RN, vss, BBS= Clear.  

## 2015-06-22 NOTE — Progress Notes (Signed)
Called to room to assist during endoscopic procedure.  Patient ID and intended procedure confirmed with present staff. Received instructions for my participation in the procedure from the performing physician.  

## 2015-06-22 NOTE — Telephone Encounter (Signed)
Called and spoke to pt's wife and it is his Neck to get MRI on NOT his hip. Will place order and pt's wife aware

## 2015-06-22 NOTE — Op Note (Signed)
[place] Patient Name: Charles Underwood Procedure Date: 06/22/2015 8:26 AM MRN: [id number] Endoscopist: Ladene Artist , MD Age: 70 Referring MD:  Date of Birth: [birth year] Gender: Male Procedure:                Colonoscopy Indications:              Surveillance: Personal history of adenomatous                            polyps on last colonoscopy > 5 years ago Medicines:                Monitored Anesthesia Care Procedure:                Pre-Anesthesia Assessment:                           - Prior to the procedure, a History and Physical                            was performed, and patient medications and                            allergies were reviewed. The patient's tolerance of                            previous anesthesia was also reviewed. The risks                            and benefits of the procedure and the sedation                            options and risks were discussed with the patient.                            All questions were answered, and informed consent                            was obtained. Prior Anticoagulants: The patient has                            taken no previous anticoagulant or antiplatelet                            agents. ASA Grade Assessment: III - A patient with                            severe systemic disease. After reviewing the risks                            and benefits, the patient was deemed in                            satisfactory condition to undergo the procedure.  After obtaining informed consent, the colonoscope                            was passed under direct vision. Throughout the                            procedure, the patient's blood pressure, pulse, and                            oxygen saturations were monitored continuously. The                            Model PCF-[id number]) scope was introduced                            through the anus and advanced to the the  cecum,                            identified by appendiceal orifice and ileocecal                            valve. The colonoscopy was performed without                            difficulty. The patient tolerated the procedure                            well. The quality of the bowel preparation was                            good. The ileocecal valve, appendiceal orifice, and                            rectum were photographed. Scope In: 8:33:33 AM Scope Out: 8:59:50 AM Scope Withdrawal Time: 0 hours 22 minutes 54 seconds  Total Procedure Duration: 0 hours 26 minutes 17 seconds  Findings:      The digital rectal exam was normal.      A 20 mm polyp was found in the proximal ascending colon. The polyp was       sessile. The polyp was removed with a piecemeal technique using a hot       snare. Resection and retrieval were complete.      Two sessile polyps were found in the distal ascending colon. The polyps       were 7 mm in size. These polyps were removed with a cold snare.       Resection and retrieval were complete.      Multiple small-mouthed diverticula were found in the sigmoid colon.      Internal hemorrhoids were found during retroflexion. The hemorrhoids       were Grade I (internal hemorrhoids that do not prolapse).      The exam was otherwise normal throughout the examined colon. Complications:            No immediate complications. Estimated Blood Loss:     Estimated blood loss was minimal. Impression:               -  One 20 mm polyp in the proximal ascending colon,                            removed piecemeal using a hot snare. Resected and                            retrieved.                           - Two 7 mm polyps in the distal ascending colon,                            removed with a cold snare. Resected and retrieved.                           - Diverticulosis in the sigmoid colon.                           - Internal hemorrhoids. Recommendation:           -  Patient has a contact number available for                            emergencies. The signs and symptoms of potential                            delayed complications were discussed with the                            patient. Return to normal activities tomorrow.                            Written discharge instructions were provided to the                            patient.                           - Resume previous diet.                           - No aspirin, ibuprofen, naproxen, or other                            non-steroidal anti-inflammatory drugs for 2 weeks                            after polyp removal.                           - Await pathology results.                           - Repeat colonoscopy in 1 year for surveillance  after piecemeal polypectomy. Procedure Code(s):        --- Professional ---                           [phone], Colonoscopy, flexible; with removal of                            tumor(s), polyp(s), or other lesion(s) by snare                            technique CPT copyright 2016 American Medical Association. All rights reserved. Ladene Artist, MD 06/22/2015 9:06:33 AM This report has been signed electronically. Number of Addenda: 0 Referring MD:      Cletus Gash T. Dennard Schaumann, MD

## 2015-06-23 ENCOUNTER — Telehealth: Payer: Self-pay | Admitting: *Deleted

## 2015-06-23 NOTE — Telephone Encounter (Signed)
  Follow up Call-  Call back number 06/22/2015  Post procedure Call Back phone  # [phone]  Permission to leave phone message Yes     Patient questions:  Do you have a fever, pain , or abdominal swelling? No. Pain Score  0 *  Have you tolerated food without any problems? Yes.    Have you been able to return to your normal activities? Yes.    Do you have any questions about your discharge instructions: Diet   No. Medications  No. Follow up visit  No.  Do you have questions or concerns about your Care? No.  Actions: * If pain score is 4 or above: No action needed, pain <4.

## 2015-06-28 ENCOUNTER — Encounter: Payer: Self-pay | Admitting: Gastroenterology

## 2015-07-01 ENCOUNTER — Ambulatory Visit
Admission: RE | Admit: 2015-07-01 | Discharge: 2015-07-01 | Disposition: A | Discharge status: Home / Self Care | Payer: Medicare Other | Source: Ambulatory Visit | Attending: Family Medicine | Admitting: Family Medicine

## 2015-07-01 DIAGNOSIS — M542 Cervicalgia: Principal | ICD-10-CM

## 2015-07-01 DIAGNOSIS — G8929 Other chronic pain: Secondary | ICD-10-CM

## 2015-07-01 MED ORDER — GADOBENATE DIMEGLUMINE 529 MG/ML IV SOLN
20.0000 mL | Freq: Once | INTRAVENOUS | Status: AC | PRN
  Administered 2015-07-01: 20 mL via INTRAVENOUS

## 2015-07-02 ENCOUNTER — Other Ambulatory Visit: Payer: Self-pay | Admitting: Family Medicine

## 2015-07-02 ENCOUNTER — Encounter: Payer: Self-pay | Admitting: Family Medicine

## 2015-07-02 DIAGNOSIS — M542 Cervicalgia: Secondary | ICD-10-CM

## 2015-07-05 ENCOUNTER — Telehealth: Payer: Self-pay | Admitting: Family Medicine

## 2015-07-05 NOTE — Telephone Encounter (Addendum)
Pt called concerned about his MRI results of his neck and would like to speak with Dr. Dennard Schaumann. [phone]

## 2015-07-05 NOTE — Telephone Encounter (Signed)
Called and answered the patient's questions. Recommended aspirin 81 mg a day given the left vertebral artery stenosis. Recommended his LDL cholesterol be below 70 and he will check with his primary care provider at the [place]. Recommended that we maintain his blood pressure less than 140/90. Proceed with an MRI of the cervical spine is a believe the majority of his pain is likely due to cervical degenerative disc disease

## 2015-07-07 DIAGNOSIS — I6509 Occlusion and stenosis of unspecified vertebral artery: Secondary | ICD-10-CM | POA: Insufficient documentation

## 2015-07-12 ENCOUNTER — Ambulatory Visit
Admission: RE | Admit: 2015-07-12 | Discharge: 2015-07-12 | Disposition: A | Discharge status: Home / Self Care | Payer: Medicare Other | Source: Ambulatory Visit | Attending: Family Medicine | Admitting: Family Medicine

## 2015-07-12 ENCOUNTER — Encounter: Payer: Self-pay | Admitting: Family Medicine

## 2015-07-12 DIAGNOSIS — M542 Cervicalgia: Secondary | ICD-10-CM

## 2015-07-20 ENCOUNTER — Other Ambulatory Visit: Payer: Self-pay | Admitting: Family Medicine

## 2015-07-20 ENCOUNTER — Telehealth: Payer: Self-pay | Admitting: Family Medicine

## 2015-07-20 DIAGNOSIS — M47812 Spondylosis without myelopathy or radiculopathy, cervical region: Secondary | ICD-10-CM

## 2015-07-20 NOTE — Telephone Encounter (Signed)
Pt's wife called about his neck. He is in a lot of pain and discomfort. He is hoping that Dr. Dennard Schaumann can refer   Nelda- [phone] cell- [phone]

## 2015-07-22 ENCOUNTER — Ambulatory Visit (INDEPENDENT_AMBULATORY_CARE_PROVIDER_SITE_OTHER): Payer: Medicare Other | Admitting: Physician Assistant

## 2015-07-22 VITALS — BP 124/76 | HR 76 | Temp 97.7°F | Resp 18 | Wt 238.0 lb

## 2015-07-22 DIAGNOSIS — T148 Other injury of unspecified body region: Secondary | ICD-10-CM

## 2015-07-22 DIAGNOSIS — R5381 Other malaise: Secondary | ICD-10-CM | POA: Diagnosis not present

## 2015-07-22 DIAGNOSIS — R509 Fever, unspecified: Secondary | ICD-10-CM | POA: Diagnosis not present

## 2015-07-22 DIAGNOSIS — W57XXXA Bitten or stung by nonvenomous insect and other nonvenomous arthropods, initial encounter: Secondary | ICD-10-CM | POA: Diagnosis not present

## 2015-07-22 LAB — CBC WITH DIFFERENTIAL/PLATELET
Basophils Absolute: 78 cells/uL (ref 0–200)
Basophils Relative: 2 %
Eosinophils Absolute: 117 cells/uL (ref 15–500)
Eosinophils Relative: 3 %
HEMATOCRIT: 41.2 % (ref 38.5–50.0)
HEMOGLOBIN: 13.6 g/dL (ref 13.0–17.0)
Lymphocytes Relative: 45 %
Lymphs Abs: 1755 cells/uL (ref 850–3900)
MCH: 28.6 pg (ref 27.0–33.0)
MCHC: 33 g/dL (ref 32.0–36.0)
MCV: 86.6 fL (ref 80.0–100.0)
MPV: 10.8 fL (ref 7.5–12.5)
Monocytes Absolute: 585 cells/uL (ref 200–950)
Monocytes Relative: 15 %
NEUTROS PCT: 35 %
Neutro Abs: 1365 cells/uL — ABNORMAL LOW (ref 1500–7800)
Platelets: 113 10*3/uL — ABNORMAL LOW (ref 140–400)
RBC: 4.76 MIL/uL (ref 4.20–5.80)
RDW: 14.8 % (ref 11.0–15.0)
WBC: 3.9 10*3/uL (ref 3.8–10.8)

## 2015-07-22 MED ORDER — DOXYCYCLINE HYCLATE 100 MG PO TABS: 100.0000 mg | ORAL_TABLET | Freq: Two times a day (BID) | ORAL | Status: DC
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
## 2015-07-26 LAB — ROCKY MTN SPOTTED FVR ABS PNL(IGG+IGM)
RMSF IGM: NOT DETECTED
RMSF IgG: NOT DETECTED

## 2015-07-27 LAB — LYME ABY, WSTRN BLT IGG & IGM W/BANDS
B BURGDORFERI IGG ABS (IB): NEGATIVE
B BURGDORFERI IGM ABS (IB): NEGATIVE
LYME DISEASE 18 KD IGG: NONREACTIVE
LYME DISEASE 30 KD IGG: NONREACTIVE
LYME DISEASE 39 KD IGG: NONREACTIVE
LYME DISEASE 41 KD IGG: NONREACTIVE
LYME DISEASE 58 KD IGG: NONREACTIVE
LYME DISEASE 93 KD IGG: NONREACTIVE
Lyme Disease 23 kD IgG: NONREACTIVE
Lyme Disease 23 kD IgM: NONREACTIVE
Lyme Disease 28 kD IgG: NONREACTIVE
Lyme Disease 39 kD IgM: NONREACTIVE
Lyme Disease 41 kD IgM: NONREACTIVE
Lyme Disease 45 kD IgG: NONREACTIVE
Lyme Disease 66 kD IgG: NONREACTIVE

## 2016-01-03 ENCOUNTER — Telehealth: Payer: Self-pay | Admitting: Cardiovascular Disease

## 2016-01-03 DIAGNOSIS — I77810 Thoracic aortic ectasia: Secondary | ICD-10-CM

## 2016-01-03 NOTE — Telephone Encounter (Signed)
Orders placed in the system for BMP and MRA Chest. Message sent to [facility] to arrange.

## 2016-01-03 NOTE — Telephone Encounter (Signed)
Charles Underwood is calling you , to set up an MRI before Charles Underwood sees Dr. Burt Knack in January . Please call

## 2016-01-04 NOTE — Telephone Encounter (Signed)
Pt scheduled for BMP on 03/22/16, MRA 03/29/2016 and office visit with Dr Burt Knack 04/05/2016.

## 2016-01-13 ENCOUNTER — Ambulatory Visit (INDEPENDENT_AMBULATORY_CARE_PROVIDER_SITE_OTHER): Payer: Medicare Other | Admitting: *Deleted

## 2016-01-13 DIAGNOSIS — Z23 Encounter for immunization: Secondary | ICD-10-CM

## 2016-01-13 NOTE — Progress Notes (Signed)
Patient ID: Charles Underwood, male   DOB: 07-Dec-1945, 70 y.o.   MRN: [id number]  Patient seen in office for Influenza Vaccination.   Tolerated IM administration well.   Immunization history updated.

## 2016-03-15 ENCOUNTER — Encounter: Payer: Self-pay | Admitting: *Deleted

## 2016-03-16 ENCOUNTER — Ambulatory Visit (INDEPENDENT_AMBULATORY_CARE_PROVIDER_SITE_OTHER): Payer: Medicare Other | Admitting: Family Medicine

## 2016-03-16 ENCOUNTER — Encounter: Payer: Self-pay | Admitting: Family Medicine

## 2016-03-16 VITALS — BP 100/66 | HR 82 | Temp 98.0°F | Resp 16 | Ht 74.0 in | Wt 242.0 lb

## 2016-03-16 DIAGNOSIS — I2510 Atherosclerotic heart disease of native coronary artery without angina pectoris: Secondary | ICD-10-CM

## 2016-03-16 DIAGNOSIS — I10 Essential (primary) hypertension: Secondary | ICD-10-CM | POA: Diagnosis not present

## 2016-03-16 DIAGNOSIS — I7789 Other specified disorders of arteries and arterioles: Secondary | ICD-10-CM | POA: Diagnosis not present

## 2016-03-16 DIAGNOSIS — Z0000 Encounter for general adult medical examination without abnormal findings: Secondary | ICD-10-CM | POA: Diagnosis not present

## 2016-03-16 DIAGNOSIS — E1100 Type 2 diabetes mellitus with hyperosmolarity without nonketotic hyperglycemic-hyperosmolar coma (NKHHC): Secondary | ICD-10-CM

## 2016-03-16 MED ORDER — EMPAGLIFLOZIN 25 MG PO TABS: 25.0000 mg | ORAL_TABLET | Freq: Every day | ORAL | 5 refills | Status: DC
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
## 2016-03-22 ENCOUNTER — Other Ambulatory Visit: Payer: Medicare Other | Admitting: *Deleted

## 2016-03-22 DIAGNOSIS — I10 Essential (primary) hypertension: Secondary | ICD-10-CM

## 2016-03-22 DIAGNOSIS — I2510 Atherosclerotic heart disease of native coronary artery without angina pectoris: Secondary | ICD-10-CM

## 2016-03-23 LAB — BASIC METABOLIC PANEL
BUN / CREAT RATIO: 20 (ref 10–24)
BUN: 24 mg/dL (ref 8–27)
CHLORIDE: 94 mmol/L — AB (ref 96–106)
CO2: 22 mmol/L (ref 18–29)
Calcium: 9.8 mg/dL (ref 8.6–10.2)
Creatinine, Ser: 1.22 mg/dL (ref 0.76–1.27)
GFR, EST AFRICAN AMERICAN: 69 mL/min/{1.73_m2} (ref >59)
GFR, EST NON AFRICAN AMERICAN: 59 mL/min/{1.73_m2} — AB (ref >59)
Glucose: 139 mg/dL — ABNORMAL HIGH (ref 65–99)
Potassium: 3.1 mmol/L — ABNORMAL LOW (ref 3.5–5.2)
Sodium: 139 mmol/L (ref 134–144)

## 2016-03-24 ENCOUNTER — Other Ambulatory Visit: Payer: Self-pay

## 2016-03-24 MED ORDER — POTASSIUM CHLORIDE ER 10 MEQ PO TBCR: 10.0000 meq | EXTENDED_RELEASE_TABLET | Freq: Every day | ORAL | 3 refills | Status: DC

## 2016-03-29 ENCOUNTER — Ambulatory Visit (HOSPITAL_COMMUNITY)
Admission: RE | Admit: 2016-03-29 | Discharge: 2016-03-29 | Disposition: A | Discharge status: Home / Self Care | Payer: Medicare Other | Source: Ambulatory Visit | Attending: Cardiovascular Disease | Admitting: Cardiovascular Disease

## 2016-03-29 DIAGNOSIS — I77810 Thoracic aortic ectasia: Secondary | ICD-10-CM | POA: Diagnosis present

## 2016-03-29 DIAGNOSIS — I712 Thoracic aortic aneurysm, without rupture: Secondary | ICD-10-CM | POA: Insufficient documentation

## 2016-03-29 MED ORDER — GADOBENATE DIMEGLUMINE 529 MG/ML IV SOLN
20.0000 mL | Freq: Once | INTRAVENOUS | Status: AC | PRN
  Administered 2016-03-29: 20 mL via INTRAVENOUS

## 2016-04-05 ENCOUNTER — Ambulatory Visit: Payer: Medicare Other | Admitting: Cardiovascular Disease

## 2016-04-07 ENCOUNTER — Encounter: Payer: Self-pay | Admitting: Cardiovascular Disease

## 2016-04-07 ENCOUNTER — Ambulatory Visit: Payer: Medicare Other | Admitting: Cardiovascular Disease

## 2016-04-07 ENCOUNTER — Ambulatory Visit (INDEPENDENT_AMBULATORY_CARE_PROVIDER_SITE_OTHER): Payer: Medicare Other | Admitting: Cardiovascular Disease

## 2016-04-07 VITALS — BP 104/82 | HR 76 | Ht 74.0 in | Wt 231.4 lb

## 2016-04-07 DIAGNOSIS — I7121 Aneurysm of the ascending aorta, without rupture: Secondary | ICD-10-CM

## 2016-04-07 DIAGNOSIS — I10 Essential (primary) hypertension: Secondary | ICD-10-CM

## 2016-04-07 DIAGNOSIS — I712 Thoracic aortic aneurysm, without rupture: Secondary | ICD-10-CM | POA: Diagnosis not present

## 2016-04-07 DIAGNOSIS — I77810 Thoracic aortic ectasia: Secondary | ICD-10-CM
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
## 2016-04-07 NOTE — Patient Instructions (Addendum)
Medication Instructions:  Your physician recommends that you continue on your current medications as directed. Please refer to the Current Medication list given to you today.  Labwork: No new orders.  Testing/Procedures: Your physician has recommended an MRA of Chest with and without contrast in 1 year. You will need a BMP 1 week prior to this test.   Follow-Up: Your physician wants you to follow-up in: 1 YEAR with Dr Burt Knack.  You will receive a reminder letter in the mail two months in advance. If you don't receive a letter, please call our office to schedule the follow-up appointment.   Any Other Special Instructions Will Be Listed Below (If Applicable).     If you need a refill on your cardiac medications before your next appointment, please call your pharmacy.

## 2016-04-18 ENCOUNTER — Ambulatory Visit (HOSPITAL_COMMUNITY): Payer: Medicare Other

## 2016-04-28 ENCOUNTER — Encounter: Payer: Self-pay | Admitting: Gastroenterology

## 2016-05-01 ENCOUNTER — Encounter: Payer: Self-pay | Admitting: Gastroenterology

## 2016-06-08 ENCOUNTER — Ambulatory Visit
Admission: RE | Admit: 2016-06-08 | Discharge: 2016-06-08 | Disposition: A | Discharge status: Home / Self Care | Payer: Medicare Other | Source: Ambulatory Visit | Attending: Family Medicine | Admitting: Family Medicine

## 2016-06-08 ENCOUNTER — Ambulatory Visit (INDEPENDENT_AMBULATORY_CARE_PROVIDER_SITE_OTHER): Payer: Medicare Other | Admitting: Family Medicine

## 2016-06-08 ENCOUNTER — Encounter: Payer: Self-pay | Admitting: Family Medicine

## 2016-06-08 VITALS — BP 116/72 | HR 86 | Temp 98.7°F | Resp 18 | Ht 74.0 in | Wt 233.0 lb

## 2016-06-08 DIAGNOSIS — M79605 Pain in left leg: Secondary | ICD-10-CM
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
## 2016-06-09 ENCOUNTER — Telehealth: Payer: Self-pay | Admitting: Family Medicine

## 2016-06-09 MED ORDER — GABAPENTIN 300 MG PO CAPS: 300.0000 mg | ORAL_CAPSULE | Freq: Three times a day (TID) | ORAL | 0 refills | Status: DC | PRN

## 2016-06-09 NOTE — Telephone Encounter (Signed)
-----   Message from Susy Frizzle, MD sent at 06/09/2016  6:57 AM EDT ----- Good news, xray is normal.  I suspect nerve pain.  I would try gabapentin 300 mg potid PRN pain

## 2016-06-09 NOTE — Telephone Encounter (Signed)
Pt aware of xray results and provider recommendations.  Rx to pharmacy

## 2016-06-13 ENCOUNTER — Ambulatory Visit (AMBULATORY_SURGERY_CENTER): Payer: Self-pay

## 2016-06-13 VITALS — Ht 74.0 in | Wt 232.0 lb

## 2016-06-13 DIAGNOSIS — Z860100 Personal history of colon polyps, unspecified: Secondary | ICD-10-CM

## 2016-06-13 MED ORDER — SUPREP BOWEL PREP KIT 17.5-3.13-1.6 GM/177ML PO SOLN: 1.0000 | Freq: Once | ORAL | 0 refills | Status: AC

## 2016-06-13 NOTE — Progress Notes (Signed)
No diet meds No allergies to eggs or soy No home oxygen No past problems with anesthesia  Declined emmi 

## 2016-06-15 ENCOUNTER — Encounter: Payer: Self-pay | Admitting: Family Medicine

## 2016-06-15 ENCOUNTER — Ambulatory Visit (INDEPENDENT_AMBULATORY_CARE_PROVIDER_SITE_OTHER): Payer: Medicare Other | Admitting: Family Medicine

## 2016-06-15 VITALS — BP 110/68 | HR 80 | Temp 97.9°F | Resp 14 | Ht 74.0 in | Wt 228.0 lb

## 2016-06-15 DIAGNOSIS — E119 Type 2 diabetes mellitus without complications: Secondary | ICD-10-CM | POA: Diagnosis not present

## 2016-06-15 DIAGNOSIS — M47812 Spondylosis without myelopathy or radiculopathy, cervical region: Secondary | ICD-10-CM

## 2016-06-15 LAB — COMPLETE METABOLIC PANEL WITH GFR
ALBUMIN: 4.4 g/dL (ref 3.6–5.1)
ALK PHOS: 61 U/L (ref 40–115)
ALT: 17 U/L (ref 9–46)
AST: 16 U/L (ref 10–35)
BILIRUBIN TOTAL: 0.6 mg/dL (ref 0.2–1.2)
BUN: 19 mg/dL (ref 7–25)
CO2: 22 mmol/L (ref 20–31)
Calcium: 9.7 mg/dL (ref 8.6–10.3)
Chloride: 102 mmol/L (ref 98–110)
Creat: 1.21 mg/dL — ABNORMAL HIGH (ref 0.70–1.18)
GFR, EST AFRICAN AMERICAN: 69 mL/min (ref >=60)
GFR, EST NON AFRICAN AMERICAN: 60 mL/min (ref >=60)
Glucose, Bld: 117 mg/dL — ABNORMAL HIGH (ref 70–99)
POTASSIUM: 3.7 mmol/L (ref 3.5–5.3)
Sodium: 141 mmol/L (ref 135–146)
TOTAL PROTEIN: 7.2 g/dL (ref 6.1–8.1)

## 2016-06-15 LAB — CBC WITH DIFFERENTIAL/PLATELET
BASOS ABS: 0 {cells}/uL (ref 0–200)
Basophils Relative: 0 %
EOS ABS: 200 {cells}/uL (ref 15–500)
Eosinophils Relative: 4 %
HCT: 41.8 % (ref 38.5–50.0)
HEMOGLOBIN: 13.8 g/dL (ref 13.0–17.0)
LYMPHS ABS: 1500 {cells}/uL (ref 850–3900)
Lymphocytes Relative: 30 %
MCH: 28 pg (ref 27.0–33.0)
MCHC: 33 g/dL (ref 32.0–36.0)
MCV: 85 fL (ref 80.0–100.0)
MONOS PCT: 8 %
MPV: 10.7 fL (ref 7.5–12.5)
Monocytes Absolute: 400 cells/uL (ref 200–950)
NEUTROS ABS: 2900 {cells}/uL (ref 1500–7800)
NEUTROS PCT: 58 %
Platelets: 162 10*3/uL (ref 140–400)
RBC: 4.92 MIL/uL (ref 4.20–5.80)
RDW: 15 % (ref 11.0–15.0)
WBC: 5 10*3/uL (ref 3.8–10.8)

## 2016-06-15 LAB — LIPID PANEL
CHOLESTEROL: 108 mg/dL (ref <200)
HDL: 40 mg/dL — AB (ref >40)
LDL Cholesterol: 51 mg/dL (ref <100)
TRIGLYCERIDES: 83 mg/dL (ref <150)
Total CHOL/HDL Ratio: 2.7 Ratio (ref <5.0)
VLDL: 17 mg/dL (ref <30)

## 2016-06-15 MED ORDER — OXYCODONE-ACETAMINOPHEN 7.5-325 MG PO TABS: 1.0000 | ORAL_TABLET | ORAL | 0 refills | Status: DC | PRN
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
## 2016-06-16 LAB — HEMOGLOBIN A1C
Hgb A1c MFr Bld: 6.4 % — ABNORMAL HIGH (ref <5.7)
Mean Plasma Glucose: 137 mg/dL

## 2016-06-21 ENCOUNTER — Encounter: Payer: Self-pay | Admitting: Gastroenterology

## 2016-07-03 ENCOUNTER — Telehealth: Payer: Self-pay
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
## 2016-07-04 ENCOUNTER — Encounter: Payer: Medicare Other | Admitting: Gastroenterology

## 2016-07-07 ENCOUNTER — Other Ambulatory Visit: Payer: Self-pay | Admitting: Family Medicine

## 2016-07-17 ENCOUNTER — Other Ambulatory Visit: Payer: Self-pay | Admitting: Family Medicine

## 2016-07-17 MED ORDER — EMPAGLIFLOZIN 25 MG PO TABS: 25.0000 mg | ORAL_TABLET | Freq: Every day | ORAL | 1 refills | Status: DC

## 2016-07-17 NOTE — Telephone Encounter (Signed)
Pharmacy requesting 90 day refill on Jardiance - sent to pharm

## 2016-08-16 ENCOUNTER — Encounter: Payer: Medicare Other | Admitting: Gastroenterology

## 2016-10-05 ENCOUNTER — Ambulatory Visit (AMBULATORY_SURGERY_CENTER): Payer: Medicare Other | Admitting: Gastroenterology

## 2016-10-05 ENCOUNTER — Encounter: Payer: Self-pay | Admitting: Gastroenterology

## 2016-10-05 VITALS — BP 120/81 | HR 60 | Temp 97.5°F | Resp 10 | Ht 74.0 in | Wt 232.0 lb

## 2016-10-05 DIAGNOSIS — D122 Benign neoplasm of ascending colon: Secondary | ICD-10-CM

## 2016-10-05 DIAGNOSIS — Z86010 Personal history of colonic polyps: Secondary | ICD-10-CM | POA: Diagnosis not present

## 2016-10-05 DIAGNOSIS — D123 Benign neoplasm of transverse colon: Secondary | ICD-10-CM | POA: Diagnosis not present

## 2016-10-05 MED ORDER — SODIUM CHLORIDE 0.9 % IV SOLN: 500.0000 mL | INTRAVENOUS | Status: AC

## 2016-10-05 NOTE — Progress Notes (Signed)
Called to room to assist during endoscopic procedure.  Patient ID and intended procedure confirmed with present staff. Received instructions for my participation in the procedure from the performing physician.  

## 2016-10-05 NOTE — Progress Notes (Signed)
Report to PACU, RN, vss, BBS= Clear.  

## 2016-10-05 NOTE — Patient Instructions (Addendum)
   INFORMATION ON POLYPS,DIVERTICULOSIS,& HEMORRHOIDS GIVEN TO YOU TODAY  AWAIT PATHOLOGY RESULTS  NO ASPIRIN, ASPIRIN CONTAINING PRODUCTS (BC OR GOODY POWDERS) OR NSAIDS (IBUPROFEN, ADVIL, ALEVE, AND MOTRIN) FOR 2 WEEKS; TYLENOL IS OK TO TAKE   YOU HAD AN ENDOSCOPIC PROCEDURE TODAY AT THE [facility]:   Refer to the procedure report that was given to you for any specific questions about what was found during the examination.  If the procedure report does not answer your questions, please call your gastroenterologist to clarify.  If you requested that your care partner not be given the details of your procedure findings, then the procedure report has been included in a sealed envelope for you to review at your convenience later.  YOU SHOULD EXPECT: Some feelings of bloating in the abdomen. Passage of more gas than usual.  Walking can help get rid of the air that was put into your GI tract during the procedure and reduce the bloating. If you had a lower endoscopy (such as a colonoscopy or flexible sigmoidoscopy) you may notice spotting of blood in your stool or on the toilet paper. If you underwent a bowel prep for your procedure, you may not have a normal bowel movement for a few days.  Please Note:  You might notice some irritation and congestion in your nose or some drainage.  This is from the oxygen used during your procedure.  There is no need for concern and it should clear up in a day or so.  SYMPTOMS TO REPORT IMMEDIATELY:   Following lower endoscopy (colonoscopy or flexible sigmoidoscopy):  Excessive amounts of blood in the stool  Significant tenderness or worsening of abdominal pains  Swelling of the abdomen that is new, acute  Fever of 100F or higher    For urgent or emergent issues, a gastroenterologist can be reached at any hour by calling [phone].   DIET:  We do recommend a small meal at first, but then you may proceed to your regular diet.  Drink plenty  of fluids but you should avoid alcoholic beverages for 24 hours.  ACTIVITY:  You should plan to take it easy for the rest of today and you should NOT DRIVE or use heavy machinery until tomorrow (because of the sedation medicines used during the test).    FOLLOW UP: Our staff will call the number listed on your records the next business day following your procedure to check on you and address any questions or concerns that you may have regarding the information given to you following your procedure. If we do not reach you, we will leave a message.  However, if you are feeling well and you are not experiencing any problems, there is no need to return our call.  We will assume that you have returned to your regular daily activities without incident.  If any biopsies were taken you will be contacted by phone or by letter within the next 1-3 weeks.  Please call us at [phone] if you have not heard about the biopsies in 3 weeks.    SIGNATURES/CONFIDENTIALITY: You and/or your care partner have signed paperwork which will be entered into your electronic medical record.  These signatures attest to the fact that that the information above on your After Visit Summary has been reviewed and is understood.  Full responsibility of the confidentiality of this discharge information lies with you and/or your care-partner.

## 2016-10-05 NOTE — Op Note (Signed)
[facility] Patient Name: Charles Underwood Procedure Date: 10/05/2016 8:30 AM MRN: [id number] Endoscopist: Ladene Artist , MD Age: 71 Referring MD:  Date of Birth: [birth year] Gender: Male Account #: [id number] Procedure:                Colonoscopy Indications:              Surveillance: History of piecemeal removal adenoma                            on last colonoscopy (< 3 yrs) Medicines:                Monitored Anesthesia Care Procedure:                Pre-Anesthesia Assessment:                           - Prior to the procedure, a History and Physical                            was performed, and patient medications and                            allergies were reviewed. The patient's tolerance of                            previous anesthesia was also reviewed. The risks                            and benefits of the procedure and the sedation                            options and risks were discussed with the patient.                            All questions were answered, and informed consent                            was obtained. Prior Anticoagulants: The patient has                            taken no previous anticoagulant or antiplatelet                            agents. ASA Grade Assessment: III - A patient with                            severe systemic disease. After reviewing the risks                            and benefits, the patient was deemed in                            satisfactory condition to undergo the procedure.  After obtaining informed consent, the colonoscope                            was passed under direct vision. Throughout the                            procedure, the patient's blood pressure, pulse, and                            oxygen saturations were monitored continuously. The                            Model PCF-[id number]) scope was introduced                            through the anus and  advanced to the the cecum,                            identified by appendiceal orifice and ileocecal                            valve. The ileocecal valve, appendiceal orifice,                            and rectum were photographed. The quality of the                            bowel preparation was good. The colonoscopy was                            performed without difficulty. The patient tolerated                            the procedure well. Scope In: 8:41:36 AM Scope Out: 8:56:22 AM Scope Withdrawal Time: 0 hours 10 minutes 15 seconds  Total Procedure Duration: 0 hours 14 minutes 46 seconds  Findings:                 The perianal and digital rectal examinations were                            normal.                           A 10 mm polyp was found in the ascending colon. The                            polyp was sessile. Located at the site of the prior                            piecemeal polypectomy. The polyp was removed with a                            hot snare. Resection and retrieval were complete.  A 6 mm polyp was found in the transverse colon. The                            polyp was sessile. The polyp was removed with a                            cold snare. Resection and retrieval were complete.                           Multiple medium-mouthed diverticula were found in                            the left colon. There was no evidence of                            diverticular bleeding.                           Internal hemorrhoids were found during                            retroflexion. The hemorrhoids were small and Grade                            I (internal hemorrhoids that do not prolapse).                           The exam was otherwise without abnormality on                            direct and retroflexion views. Complications:            No immediate complications. Estimated blood loss:                             None. Estimated Blood Loss:     Estimated blood loss: none. Impression:               - One 10 mm polyp in the ascending colon, removed                            with a hot snare. Resected and retrieved.                           - One 6 mm polyp in the transverse colon, removed                            with a cold snare. Resected and retrieved.                           - Moderate diverticulosis in the left colon. There                            was no evidence of diverticular bleeding.                           -  Internal hemorrhoids.                           - The examination was otherwise normal on direct                            and retroflexion views. Recommendation:           - Repeat colonoscopy in 3 years for surveillance.                           - Patient has a contact number available for                            emergencies. The signs and symptoms of potential                            delayed complications were discussed with the                            patient. Return to normal activities tomorrow.                            Written discharge instructions were provided to the                            patient.                           - Resume previous diet.                           - Continue present medications.                           - Await pathology results.                           - No aspirin, ibuprofen, naproxen, or other                            non-steroidal anti-inflammatory drugs for 2 weeks                            after polyp removal. Ladene Artist, MD 10/05/2016 9:01:07 AM This report has been signed electronically.

## 2016-10-06 ENCOUNTER — Telehealth: Payer: Self-pay
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
## 2016-10-12 ENCOUNTER — Encounter: Payer: Self-pay | Admitting: Gastroenterology

## 2016-10-20 ENCOUNTER — Ambulatory Visit (INDEPENDENT_AMBULATORY_CARE_PROVIDER_SITE_OTHER): Payer: Medicare Other | Admitting: Family Medicine

## 2016-10-20 ENCOUNTER — Encounter: Payer: Self-pay | Admitting: Family Medicine

## 2016-10-20 ENCOUNTER — Telehealth: Payer: Self-pay

## 2016-10-20 VITALS — BP 100/60 | HR 72 | Temp 97.8°F | Resp 16 | Ht 74.0 in | Wt 228.0 lb

## 2016-10-20 DIAGNOSIS — R319 Hematuria, unspecified: Secondary | ICD-10-CM

## 2016-10-20 LAB — URINALYSIS, ROUTINE W REFLEX MICROSCOPIC
Bilirubin Urine: NEGATIVE
HGB URINE DIPSTICK: NEGATIVE
Ketones, ur: NEGATIVE
LEUKOCYTES UA: NEGATIVE
Nitrite: NEGATIVE
PH: 5.5 (ref 5.0–8.0)
Specific Gravity, Urine: 1.02 (ref 1.001–1.035)

## 2016-10-20 LAB — URINALYSIS, MICROSCOPIC ONLY
BACTERIA UA: NONE SEEN [HPF]
Casts: NONE SEEN [LPF]
Crystals: NONE SEEN [HPF]
RBC / HPF: NONE SEEN RBC/HPF (ref <=2)
WBC, UA: NONE SEEN WBC/HPF (ref <=5)
Yeast: NONE SEEN [HPF]
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
## 2016-10-20 NOTE — Telephone Encounter (Signed)
Patient wife called and stated her husband had blood in his underwear about the size of a half dollar. Wife states patient is complaining of right side pain and patietn just noticed the blood this morning.   Wife denies patient having visible blood in urine or stools, Wife denies patient having back pain. I will schedule to have the patient come in today as indicated to wife it could be a number of things and patient urine would need to be checked.

## 2016-10-30 ENCOUNTER — Other Ambulatory Visit: Payer: Medicare Other

## 2016-10-30 DIAGNOSIS — R319 Hematuria, unspecified: Secondary | ICD-10-CM

## 2016-10-30 LAB — URINALYSIS, MICROSCOPIC ONLY
Bacteria, UA: NONE SEEN [HPF]
CASTS: NONE SEEN [LPF]
CRYSTALS: NONE SEEN [HPF]
RBC / HPF: NONE SEEN RBC/HPF (ref <=2)
Squamous Epithelial / LPF: NONE SEEN [HPF] (ref <=5)
WBC UA: NONE SEEN WBC/HPF (ref <=5)
Yeast: NONE SEEN [HPF]

## 2016-10-30 LAB — URINALYSIS, ROUTINE W REFLEX MICROSCOPIC
Bilirubin Urine: NEGATIVE
HGB URINE DIPSTICK: NEGATIVE
Ketones, ur: NEGATIVE
LEUKOCYTES UA: NEGATIVE
Nitrite: NEGATIVE
PH: 5.5 (ref 5.0–8.0)
Protein, ur: NEGATIVE
SPECIFIC GRAVITY, URINE: 1.01 (ref 1.001–1.035)

## 2016-11-16 ENCOUNTER — Telehealth: Payer: Self-pay | Admitting: Family Medicine

## 2016-11-16 ENCOUNTER — Encounter: Payer: Self-pay | Admitting: Family Medicine

## 2016-11-16 ENCOUNTER — Ambulatory Visit (INDEPENDENT_AMBULATORY_CARE_PROVIDER_SITE_OTHER): Payer: Medicare Other | Admitting: Family Medicine

## 2016-11-16 VITALS — BP 100/60 | HR 62 | Temp 97.7°F | Resp 16 | Ht 74.0 in | Wt 229.0 lb

## 2016-11-16 DIAGNOSIS — N5082 Scrotal pain: Secondary | ICD-10-CM

## 2016-11-16 DIAGNOSIS — N410 Acute prostatitis: Secondary | ICD-10-CM | POA: Diagnosis not present

## 2016-11-16 MED ORDER — GABAPENTIN 300 MG PO CAPS: ORAL_CAPSULE | ORAL | 5 refills | Status: DC

## 2016-11-16 MED ORDER — CIPROFLOXACIN HCL 500 MG PO TABS: 500.0000 mg | ORAL_TABLET | Freq: Two times a day (BID) | ORAL | 0 refills | Status: DC

## 2016-11-16 NOTE — Telephone Encounter (Signed)
Patient needs his gabenpentin he uses [facility] on [address]   CB# [phone]
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
## 2016-11-16 NOTE — Telephone Encounter (Signed)
Medication called/sent to requested pharmacy  

## 2016-11-17 LAB — PSA: PSA: 2.6 ng/mL

## 2016-12-05 ENCOUNTER — Ambulatory Visit (INDEPENDENT_AMBULATORY_CARE_PROVIDER_SITE_OTHER): Payer: Medicare Other | Admitting: Family Medicine

## 2016-12-05 ENCOUNTER — Encounter: Payer: Self-pay | Admitting: Family Medicine

## 2016-12-05 ENCOUNTER — Ambulatory Visit
Admission: RE | Admit: 2016-12-05 | Discharge: 2016-12-05 | Disposition: A | Discharge status: Home / Self Care | Payer: Medicare Other | Source: Ambulatory Visit | Attending: Family Medicine | Admitting: Family Medicine

## 2016-12-05 VITALS — BP 112/76 | HR 76 | Temp 98.2°F | Resp 16 | Ht 75.0 in | Wt 230.0 lb

## 2016-12-05 DIAGNOSIS — M25475 Effusion, left foot: Secondary | ICD-10-CM

## 2016-12-05 LAB — URIC ACID: URIC ACID, SERUM: 3.5 mg/dL — AB (ref 4.0–8.0)
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
## 2016-12-14 ENCOUNTER — Other Ambulatory Visit: Payer: Self-pay | Admitting: *Deleted

## 2016-12-14 MED ORDER — DICLOFENAC SODIUM 75 MG PO TBEC: 75.0000 mg | DELAYED_RELEASE_TABLET | Freq: Two times a day (BID) | ORAL | 1 refills | Status: DC

## 2016-12-21 ENCOUNTER — Telehealth: Payer: Self-pay | Admitting: Family Medicine

## 2016-12-21 NOTE — Telephone Encounter (Signed)
patient is requesting new rx for cpap mask because his mask is causing pain at night.Patient would like new comfort gel mask  Size medium. Patient had sleep study done in [place] about 5 years ago. Patient is requesting a new rx because he purchased a discontinued cpap mask from [facility] [employer] and would like to be reimbursed from his inurance  I explained to patient we do not have sleep study notes to provide.Patient states he will call [facility] to see about getting a copy and will submit information to insurance for reimbursement.

## 2016-12-21 NOTE — Telephone Encounter (Signed)
Pt calling about having issues with his cpap machine can you please call him back.

## 2017-01-01 ENCOUNTER — Telehealth: Payer: Self-pay | Admitting: Family Medicine

## 2017-01-01 NOTE — Telephone Encounter (Signed)
rx faxed to [email or web address].lvm for patient

## 2017-01-01 NOTE — Telephone Encounter (Signed)
Patient called back states he still needs a rx for the cpap mask that he bought from [place] earlier this month. He states that he will reach out to the [place] to see if he can get copies of sleep study report.  He states that [place] is saying he needs this prescription at first they told him if he was paying for it out of pocket he didn't need one but now they are saying he needs a prescription  CB# [phone]

## 2017-01-04 ENCOUNTER — Ambulatory Visit (INDEPENDENT_AMBULATORY_CARE_PROVIDER_SITE_OTHER): Payer: Medicare Other | Admitting: *Deleted

## 2017-01-04 DIAGNOSIS — Z23 Encounter for immunization: Secondary | ICD-10-CM

## 2017-02-22 ENCOUNTER — Telehealth: Payer: Self-pay | Admitting: Cardiovascular Disease

## 2017-02-22 ENCOUNTER — Other Ambulatory Visit: Payer: Self-pay

## 2017-02-22 DIAGNOSIS — I10 Essential (primary) hypertension: Secondary | ICD-10-CM

## 2017-02-22 NOTE — Telephone Encounter (Signed)
New message    Patient wants to confirm that he needs to get MRA in 2019 before scheduling MRA.  Offered to set up with proper person, patient wants to speak with a nurse first. Please call

## 2017-02-22 NOTE — Telephone Encounter (Signed)
Per DPR form, left detailed message that patient will need MRA prior to appointment. Instructed him to call back if he'd like further clarification.

## 2017-02-23 ENCOUNTER — Telehealth: Payer: Self-pay | Admitting: Family Medicine

## 2017-02-23 ENCOUNTER — Other Ambulatory Visit: Payer: Self-pay | Admitting: Family Medicine

## 2017-02-23 MED ORDER — EMPAGLIFLOZIN 25 MG PO TABS: 25.0000 mg | ORAL_TABLET | Freq: Every day | ORAL | 1 refills | Status: DC

## 2017-02-23 NOTE — Telephone Encounter (Signed)
Pt needs refill on jardiance sent to [facility]. Pt wants to know if we have samples of jardiance because pt is out

## 2017-02-23 NOTE — Telephone Encounter (Signed)
Medication called/sent to requested pharmacy  

## 2017-02-28 NOTE — Telephone Encounter (Signed)
Medication called/sent to requested pharmacy on 02/23/17 and we do not have samples

## 2017-03-04 ENCOUNTER — Other Ambulatory Visit: Payer: Self-pay | Admitting: Cardiovascular Disease

## 2017-04-09 ENCOUNTER — Other Ambulatory Visit: Payer: Medicare Other | Admitting: *Deleted

## 2017-04-09 DIAGNOSIS — I10 Essential (primary) hypertension: Secondary | ICD-10-CM

## 2017-04-09 LAB — BASIC METABOLIC PANEL
BUN/Creatinine Ratio: 18 (ref 10–24)
BUN: 20 mg/dL (ref 8–27)
CALCIUM: 9.5 mg/dL (ref 8.6–10.2)
CO2: 28 mmol/L (ref 20–29)
Chloride: 99 mmol/L (ref 96–106)
Creatinine, Ser: 1.14 mg/dL (ref 0.76–1.27)
GFR, EST AFRICAN AMERICAN: 74 mL/min/{1.73_m2} (ref >59)
GFR, EST NON AFRICAN AMERICAN: 64 mL/min/{1.73_m2} (ref >59)
Glucose: 128 mg/dL — ABNORMAL HIGH (ref 65–99)
POTASSIUM: 3.5 mmol/L (ref 3.5–5.2)
Sodium: 142 mmol/L (ref 134–144)

## 2017-04-11 ENCOUNTER — Ambulatory Visit (HOSPITAL_COMMUNITY)
Admission: RE | Admit: 2017-04-11 | Discharge: 2017-04-11 | Disposition: A | Discharge status: Home / Self Care | Payer: Medicare Other | Source: Ambulatory Visit | Attending: Cardiovascular Disease | Admitting: Cardiovascular Disease

## 2017-04-11 DIAGNOSIS — I10 Essential (primary) hypertension: Secondary | ICD-10-CM | POA: Diagnosis not present

## 2017-04-11 DIAGNOSIS — I712 Thoracic aortic aneurysm, without rupture: Secondary | ICD-10-CM | POA: Diagnosis present

## 2017-04-11 DIAGNOSIS — I77810 Thoracic aortic ectasia: Secondary | ICD-10-CM

## 2017-04-11 DIAGNOSIS — I7121 Aneurysm of the ascending aorta, without rupture: Secondary | ICD-10-CM

## 2017-04-11 MED ORDER — GADOBENATE DIMEGLUMINE 529 MG/ML IV SOLN
20.0000 mL | Freq: Once | INTRAVENOUS | Status: AC | PRN
  Administered 2017-04-11: 20 mL via INTRAVENOUS

## 2017-04-18 ENCOUNTER — Encounter: Payer: Self-pay | Admitting: Cardiovascular Disease

## 2017-04-18 ENCOUNTER — Ambulatory Visit (INDEPENDENT_AMBULATORY_CARE_PROVIDER_SITE_OTHER): Payer: Medicare Other | Admitting: Cardiovascular Disease

## 2017-04-18 VITALS — BP 116/76 | HR 65 | Ht 74.0 in | Wt 226.4 lb

## 2017-04-18 DIAGNOSIS — E785 Hyperlipidemia, unspecified: Secondary | ICD-10-CM | POA: Diagnosis not present

## 2017-04-18 DIAGNOSIS — I2510 Atherosclerotic heart disease of native coronary artery without angina pectoris: Secondary | ICD-10-CM

## 2017-04-18 DIAGNOSIS — I10 Essential (primary) hypertension: Secondary | ICD-10-CM | POA: Diagnosis not present

## 2017-04-18 DIAGNOSIS — I712 Thoracic aortic aneurysm, without rupture: Secondary | ICD-10-CM

## 2017-04-18 DIAGNOSIS — I7121 Aneurysm of the ascending aorta, without rupture: Secondary | ICD-10-CM

## 2017-04-18 MED ORDER — POTASSIUM CHLORIDE ER 10 MEQ PO TBCR: 10.0000 meq | EXTENDED_RELEASE_TABLET | Freq: Two times a day (BID) | ORAL | 3 refills | Status: DC

## 2017-04-18 MED ORDER — ASPIRIN 81 MG PO TABS: 81.0000 mg | ORAL_TABLET | Freq: Every day | ORAL | Status: DC

## 2017-04-18 MED ORDER — LOSARTAN POTASSIUM 50 MG PO TABS: 50.0000 mg | ORAL_TABLET | Freq: Every day | ORAL | 3 refills | Status: DC
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
## 2017-04-18 NOTE — Patient Instructions (Signed)
Medication Instructions:  1) INCREASE POTASSIUM to 10 meq twice daily 2) DECREASE ASPIRIN to 81 mg once daily 3) DECREASE LOSARTAN to 50 mg once daily  Labwork: None  Testing/Procedures: None  Follow-Up: Your provider wants you to follow-up in: 1 year with Dr. Burt Knack. You will receive a reminder letter in the mail two months in advance. If you don't receive a letter, please call our office to schedule the follow-up appointment.    Any Other Special Instructions Will Be Listed Below (If Applicable).     If you need a refill on your cardiac medications before your next appointment, please call your pharmacy.

## 2017-04-19 ENCOUNTER — Telehealth: Payer: Self-pay | Admitting: Cardiovascular Disease

## 2017-04-19 NOTE — Telephone Encounter (Signed)
Follow Up:    Pt's PCP is Dr Lawanna Kobus and the fax number is [phone].

## 2017-04-19 NOTE — Telephone Encounter (Signed)
Per DPR form, left message that Dr. Antionette Char recent OV note will be faxed as soon as it is signed. Instructed patient to call back with any questions or concerns.

## 2017-04-20 ENCOUNTER — Encounter: Payer: Self-pay | Admitting: Cardiovascular Disease

## 2017-04-20 NOTE — Telephone Encounter (Signed)
Faxed via Epic to Dr. Beckey Downing to number provided.

## 2017-06-02 ENCOUNTER — Other Ambulatory Visit: Payer: Self-pay | Admitting: Cardiovascular Disease

## 2017-08-09 ENCOUNTER — Ambulatory Visit (INDEPENDENT_AMBULATORY_CARE_PROVIDER_SITE_OTHER): Payer: Medicare Other | Admitting: Family Medicine

## 2017-08-09 ENCOUNTER — Encounter: Payer: Self-pay | Admitting: Family Medicine

## 2017-08-09 ENCOUNTER — Other Ambulatory Visit: Payer: Self-pay

## 2017-08-09 VITALS — BP 112/72 | HR 78 | Temp 97.8°F | Resp 14 | Ht 74.0 in | Wt 226.0 lb

## 2017-08-09 DIAGNOSIS — R109 Unspecified abdominal pain: Secondary | ICD-10-CM | POA: Diagnosis not present

## 2017-08-09 DIAGNOSIS — M958 Other specified acquired deformities of musculoskeletal system: Secondary | ICD-10-CM

## 2017-08-09 DIAGNOSIS — R079 Chest pain, unspecified: Secondary | ICD-10-CM

## 2017-08-09 DIAGNOSIS — M47812 Spondylosis without myelopathy or radiculopathy, cervical region: Secondary | ICD-10-CM | POA: Diagnosis not present

## 2017-08-09 LAB — URINALYSIS, ROUTINE W REFLEX MICROSCOPIC
BILIRUBIN URINE: NEGATIVE
Hgb urine dipstick: NEGATIVE
Ketones, ur: NEGATIVE
Leukocytes, UA: NEGATIVE
Nitrite: NEGATIVE
PROTEIN: NEGATIVE
SPECIFIC GRAVITY, URINE: 1.01 (ref 1.001–1.03)
pH: 5 (ref 5.0–8.0)

## 2017-08-09 MED ORDER — OXYCODONE-ACETAMINOPHEN 7.5-325 MG PO TABS: 1.0000 | ORAL_TABLET | ORAL | 0 refills | Status: DC | PRN
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
## 2017-11-03 ENCOUNTER — Other Ambulatory Visit: Payer: Self-pay | Admitting: Family Medicine

## 2017-12-07 ENCOUNTER — Encounter: Payer: Self-pay | Admitting: Family Medicine

## 2017-12-07 ENCOUNTER — Ambulatory Visit (INDEPENDENT_AMBULATORY_CARE_PROVIDER_SITE_OTHER): Payer: Medicare Other | Admitting: Family Medicine

## 2017-12-07 VITALS — BP 108/64 | HR 83 | Temp 98.0°F | Resp 16 | Ht 74.0 in | Wt 227.0 lb

## 2017-12-07 DIAGNOSIS — R079 Chest pain, unspecified: Secondary | ICD-10-CM | POA: Diagnosis not present

## 2017-12-07 MED ORDER — SUCRALFATE 1 G PO TABS: 1.0000 g | ORAL_TABLET | Freq: Three times a day (TID) | ORAL | 0 refills | Status: DC
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
## 2017-12-24 ENCOUNTER — Ambulatory Visit (INDEPENDENT_AMBULATORY_CARE_PROVIDER_SITE_OTHER): Payer: Medicare Other | Admitting: *Deleted

## 2017-12-24 DIAGNOSIS — Z23 Encounter for immunization: Secondary | ICD-10-CM

## 2018-01-02 ENCOUNTER — Telehealth: Payer: Self-pay | Admitting: Family Medicine

## 2018-01-02 NOTE — Telephone Encounter (Signed)
Patient's wife called [facility] stating that he was no better and still having chest pains. Per Dr. Alexis Goodell pt was to f/u in one week or sooner if worse and that was 12/07/17 or go to ER if getting worse. Called and left detailed message on vm to call and schedule a f/u appointment.

## 2018-01-04 ENCOUNTER — Ambulatory Visit (INDEPENDENT_AMBULATORY_CARE_PROVIDER_SITE_OTHER): Payer: Medicare Other | Admitting: Family Medicine

## 2018-01-04 ENCOUNTER — Encounter: Payer: Self-pay | Admitting: Family Medicine

## 2018-01-04 VITALS — BP 118/74 | HR 87 | Temp 97.8°F | Ht 74.0 in | Wt 228.0 lb

## 2018-01-04 DIAGNOSIS — Z23 Encounter for immunization: Secondary | ICD-10-CM | POA: Diagnosis not present

## 2018-01-04 DIAGNOSIS — R079 Chest pain, unspecified: Secondary | ICD-10-CM

## 2018-01-04 DIAGNOSIS — K219 Gastro-esophageal reflux disease without esophagitis: Secondary | ICD-10-CM | POA: Diagnosis not present
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
## 2018-01-09 ENCOUNTER — Encounter: Payer: Self-pay | Admitting: Cardiology
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
## 2018-01-11 ENCOUNTER — Encounter: Payer: Self-pay | Admitting: Cardiology

## 2018-01-11 ENCOUNTER — Ambulatory Visit (INDEPENDENT_AMBULATORY_CARE_PROVIDER_SITE_OTHER): Payer: Medicare Other | Admitting: Cardiology

## 2018-01-11 ENCOUNTER — Encounter: Payer: Self-pay | Admitting: *Deleted

## 2018-01-11 VITALS — BP 113/73 | HR 80 | Ht 74.0 in | Wt 227.0 lb

## 2018-01-11 DIAGNOSIS — I712 Thoracic aortic aneurysm, without rupture: Secondary | ICD-10-CM

## 2018-01-11 DIAGNOSIS — R079 Chest pain, unspecified: Secondary | ICD-10-CM | POA: Diagnosis not present

## 2018-01-11 DIAGNOSIS — I10 Essential (primary) hypertension: Secondary | ICD-10-CM | POA: Diagnosis not present

## 2018-01-11 DIAGNOSIS — E785 Hyperlipidemia, unspecified: Secondary | ICD-10-CM

## 2018-01-11 DIAGNOSIS — I2510 Atherosclerotic heart disease of native coronary artery without angina pectoris: Secondary | ICD-10-CM | POA: Diagnosis not present

## 2018-01-11 DIAGNOSIS — I7121 Aneurysm of the ascending aorta, without rupture: Secondary | ICD-10-CM

## 2018-01-11 NOTE — Patient Instructions (Addendum)
Medication Instructions:  Your physician recommends that you continue on your current medications as directed. Please refer to the Current Medication list given to you today.  If you need a refill on your cardiac medications before your next appointment, please call your pharmacy.   Lab work: None ordered  If you have labs (blood work) drawn today and your tests are completely normal, you will receive your results only by: Marland Kitchen MyChart Message (if you have MyChart) OR . A paper copy in the mail If you have any lab test that is abnormal or we need to change your treatment, we will call you to review the results.  Testing/Procedures: Your physician has requested that you have en exercise stress myoview. For further information please visit [email or web address]. Please follow instruction sheet, as given.    Follow-Up:. Your physician wants you to follow-up in: DR. Burt Knack IN 6 MONTHS.   You will receive a reminder letter in the mail two months in advance. If you don't receive a letter, please call our office to schedule the follow-up appointment.  Any Other Special Instructions Will Be Listed Below (If Applicable).

## 2018-01-14 ENCOUNTER — Telehealth (HOSPITAL_COMMUNITY): Payer: Self-pay | Admitting: *Deleted

## 2018-01-14 NOTE — Telephone Encounter (Signed)
Patient given detailed instructions per Myocardial Perfusion Study Information Sheet for the test on 01/15/18 at 1000. Patient notified to arrive 15 minutes early and that it is imperative to arrive on time for appointment to keep from having the test rescheduled.  If you need to cancel or reschedule your appointment, please call the office within 24 hours of your appointment. . Patient verbalized understanding.Almir Botts, [place]

## 2018-01-15 ENCOUNTER — Ambulatory Visit (HOSPITAL_COMMUNITY): Payer: Medicare Other | Attending: Cardiology

## 2018-01-15 DIAGNOSIS — R079 Chest pain, unspecified: Secondary | ICD-10-CM | POA: Insufficient documentation

## 2018-01-15 LAB — MYOCARDIAL PERFUSION IMAGING
CHL CUP MPHR: 147 {beats}/min
CHL CUP NUCLEAR SDS: 1
CHL CUP RESTING HR STRESS: 56 {beats}/min
CHL RATE OF PERCEIVED EXERTION: 18
CSEPED: 7 min
CSEPHR: 93 %
Estimated workload: 9.5 METS
Exercise duration (sec): 30 s
LV sys vol: 30 mL
LVDIAVOL: 87 mL (ref 62–150)
NUC STRESS TID: 0.83
Peak HR: 137 {beats}/min
SRS: 0
SSS: 1

## 2018-01-15 MED ORDER — TECHNETIUM TC 99M TETROFOSMIN IV KIT
10.7000 | PACK | Freq: Once | INTRAVENOUS | Status: AC | PRN
  Administered 2018-01-15: 10.7 via INTRAVENOUS
  Filled 2018-01-15: qty 11

## 2018-01-15 MED ORDER — TECHNETIUM TC 99M TETROFOSMIN IV KIT
31.6000 | PACK | Freq: Once | INTRAVENOUS | Status: AC | PRN
  Administered 2018-01-15: 31.6 via INTRAVENOUS
  Filled 2018-01-15: qty 32

## 2018-01-17 ENCOUNTER — Telehealth: Payer: Self-pay | Admitting: *Deleted

## 2018-01-17 NOTE — Telephone Encounter (Signed)
Called pt re: stress test results.  Left a message for pt to call back. 

## 2018-01-17 NOTE — Telephone Encounter (Signed)
-----   Message from Isaiah Serge, NP sent at 01/17/2018  8:25 AM EDT ----- Let pt know Dr. Burt Knack reviewed stress test, low risk, reassuring.  See GI.  Copy to PCP as well.

## 2018-01-17 NOTE — Telephone Encounter (Signed)
Returned pts call re: stress test results.  The patient has been notified of the result and verbalized understanding.  All questions (if any) were answered. Jeanann Lewandowsky, [place] 01/17/2018 11:26 AM

## 2018-01-17 NOTE — Telephone Encounter (Signed)
Follow up ° ° ° °Patient returning call back about results °

## 2018-01-21 ENCOUNTER — Encounter: Payer: Self-pay | Admitting: Physician Assistant

## 2018-01-21 ENCOUNTER — Ambulatory Visit (INDEPENDENT_AMBULATORY_CARE_PROVIDER_SITE_OTHER): Payer: Medicare Other | Admitting: Physician Assistant

## 2018-01-21 VITALS — BP 116/68 | HR 76 | Ht 74.0 in | Wt 231.1 lb

## 2018-01-21 DIAGNOSIS — R0789 Other chest pain: Secondary | ICD-10-CM
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
## 2018-01-21 NOTE — Patient Instructions (Signed)
If you call us back, we would be glad to send in Pantoprazole sodium 40 mg.  Just let us know.   Normal BMI (Body Mass Index- based on height and weight) is between 23 and 30. Your BMI today is Body mass index is 29.67 kg/m. Marland Kitchen Please consider follow up  regarding your BMI with your Primary Care Provider.

## 2018-01-21 NOTE — Progress Notes (Signed)
Reviewed and agree with initial management plan.  Breyon Blass T. Amparo Donalson, MD FACG 

## 2018-02-12 ENCOUNTER — Emergency Department (HOSPITAL_COMMUNITY): Payer: Medicare Other

## 2018-02-12 ENCOUNTER — Other Ambulatory Visit: Payer: Self-pay

## 2018-02-12 ENCOUNTER — Emergency Department (HOSPITAL_COMMUNITY)
Admission: EM | Admit: 2018-02-12 | Discharge: 2018-02-12 | Disposition: A | Discharge status: Home / Self Care | Payer: Medicare Other | Attending: Emergency Medicine | Admitting: Emergency Medicine

## 2018-02-12 ENCOUNTER — Encounter (HOSPITAL_COMMUNITY): Payer: Self-pay

## 2018-02-12 DIAGNOSIS — S161XXA Strain of muscle, fascia and tendon at neck level, initial encounter: Secondary | ICD-10-CM | POA: Diagnosis not present

## 2018-02-12 DIAGNOSIS — S92501A Displaced unspecified fracture of right lesser toe(s), initial encounter for closed fracture: Secondary | ICD-10-CM | POA: Diagnosis not present

## 2018-02-12 DIAGNOSIS — Z85828 Personal history of other malignant neoplasm of skin: Secondary | ICD-10-CM | POA: Insufficient documentation

## 2018-02-12 DIAGNOSIS — I10 Essential (primary) hypertension: Secondary | ICD-10-CM | POA: Insufficient documentation

## 2018-02-12 DIAGNOSIS — W1830XA Fall on same level, unspecified, initial encounter: Secondary | ICD-10-CM | POA: Insufficient documentation

## 2018-02-12 DIAGNOSIS — I2510 Atherosclerotic heart disease of native coronary artery without angina pectoris: Secondary | ICD-10-CM | POA: Insufficient documentation

## 2018-02-12 DIAGNOSIS — R55 Syncope and collapse: Secondary | ICD-10-CM

## 2018-02-12 DIAGNOSIS — Y999 Unspecified external cause status: Secondary | ICD-10-CM | POA: Insufficient documentation

## 2018-02-12 DIAGNOSIS — Y939 Activity, unspecified: Secondary | ICD-10-CM | POA: Insufficient documentation

## 2018-02-12 DIAGNOSIS — Y92009 Unspecified place in unspecified non-institutional (private) residence as the place of occurrence of the external cause: Secondary | ICD-10-CM | POA: Diagnosis not present

## 2018-02-12 LAB — CBC WITH DIFFERENTIAL/PLATELET
Abs Immature Granulocytes: 0.01 10*3/uL (ref 0.00–0.07)
BASOS ABS: 0 10*3/uL (ref 0.0–0.1)
BASOS PCT: 0 %
EOS ABS: 0.1 10*3/uL (ref 0.0–0.5)
Eosinophils Relative: 1 %
HCT: 41.7 % (ref 39.0–52.0)
Hemoglobin: 12.9 g/dL — ABNORMAL LOW (ref 13.0–17.0)
IMMATURE GRANULOCYTES: 0 %
Lymphocytes Relative: 24 %
Lymphs Abs: 0.9 10*3/uL (ref 0.7–4.0)
MCH: 24.3 pg — ABNORMAL LOW (ref 26.0–34.0)
MCHC: 30.9 g/dL (ref 30.0–36.0)
MCV: 78.5 fL — ABNORMAL LOW (ref 80.0–100.0)
Monocytes Absolute: 0.4 10*3/uL (ref 0.1–1.0)
Monocytes Relative: 9 %
NEUTROS PCT: 66 %
NRBC: 0 % (ref 0.0–0.2)
Neutro Abs: 2.4 10*3/uL (ref 1.7–7.7)
PLATELETS: 133 10*3/uL — AB (ref 150–400)
RBC: 5.31 MIL/uL (ref 4.22–5.81)
RDW: 18.5 % — AB (ref 11.5–15.5)
WBC: 3.7 10*3/uL — AB (ref 4.0–10.5)

## 2018-02-12 LAB — COMPREHENSIVE METABOLIC PANEL
ALK PHOS: 57 U/L (ref 38–126)
ALT: 23 U/L (ref 0–44)
AST: 25 U/L (ref 15–41)
Albumin: 3.8 g/dL (ref 3.5–5.0)
Anion gap: 11 (ref 5–15)
BUN: 30 mg/dL — AB (ref 8–23)
CALCIUM: 8.8 mg/dL — AB (ref 8.9–10.3)
CO2: 28 mmol/L (ref 22–32)
Chloride: 96 mmol/L — ABNORMAL LOW (ref 98–111)
Creatinine, Ser: 1.38 mg/dL — ABNORMAL HIGH (ref 0.61–1.24)
GFR, EST AFRICAN AMERICAN: 58 mL/min — AB (ref >60)
GFR, EST NON AFRICAN AMERICAN: 50 mL/min — AB (ref >60)
Glucose, Bld: 184 mg/dL — ABNORMAL HIGH (ref 70–99)
Potassium: 2.9 mmol/L — ABNORMAL LOW (ref 3.5–5.1)
Sodium: 135 mmol/L (ref 135–145)
Total Bilirubin: 0.5 mg/dL (ref 0.3–1.2)
Total Protein: 7.3 g/dL (ref 6.5–8.1)

## 2018-02-12 LAB — I-STAT TROPONIN, ED: Troponin i, poc: 0 ng/mL (ref 0.00–0.08)

## 2018-02-12 MED ORDER — HYDROCODONE-ACETAMINOPHEN 5-325 MG PO TABS: 1.0000 | ORAL_TABLET | Freq: Four times a day (QID) | ORAL | 0 refills | Status: DC | PRN

## 2018-02-12 MED ORDER — ONDANSETRON HCL 4 MG/2ML IJ SOLN
4.0000 mg | Freq: Once | INTRAMUSCULAR | Status: AC
  Administered 2018-02-12: 4 mg via INTRAVENOUS
  Filled 2018-02-12: qty 2

## 2018-02-12 MED ORDER — SODIUM CHLORIDE 0.9 % IV BOLUS
1000.0000 mL | Freq: Once | INTRAVENOUS | Status: AC
  Administered 2018-02-12: 1000 mL via INTRAVENOUS

## 2018-02-12 MED ORDER — MORPHINE SULFATE (PF) 4 MG/ML IV SOLN
4.0000 mg | Freq: Once | INTRAVENOUS | Status: AC
  Administered 2018-02-12: 4 mg via INTRAVENOUS
  Filled 2018-02-12: qty 1

## 2018-02-12 NOTE — ED Triage Notes (Signed)
Pt had syncopal episode in the restroom today at about 0600. Per wife, after he finished voiding, pt "blacked out and fell on his back". Pt was able to crawl back to bed, but felt worse and worse while laying. Pt has been having balance issues per wife. She also states he was running a low grade fever this weekend.  Pt's sugar at home, prior to eating was 132.
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
## 2018-02-12 NOTE — Discharge Instructions (Addendum)
Continue medications as previously prescribed.  Hydrocodone is prescribed as needed for pain.  Ice your toe 20 minutes every 2 hours while awake for the next 2 days.  Follow-up with your primary doctor in the next 1 to 2 weeks, and return to the ER if symptoms significantly worsen or change.

## 2018-03-27 ENCOUNTER — Other Ambulatory Visit: Payer: Self-pay | Admitting: Family Medicine

## 2018-04-24 ENCOUNTER — Telehealth: Payer: Self-pay | Admitting: Family Medicine

## 2018-04-24 NOTE — Telephone Encounter (Signed)
Pt needed notes faxed to [facility] in [place] for his Vania Rea so that he can get it through them. Spoke to [facility] and notes were faxed via Epic.   Fax - [phone] Phone - [phone] ext [phone]

## 2018-05-20 ENCOUNTER — Encounter: Payer: Self-pay | Admitting: Cardiovascular Disease

## 2018-05-20 ENCOUNTER — Ambulatory Visit (INDEPENDENT_AMBULATORY_CARE_PROVIDER_SITE_OTHER): Payer: Medicare Other | Admitting: Cardiovascular Disease

## 2018-05-20 ENCOUNTER — Telehealth: Payer: Self-pay | Admitting: Cardiovascular Disease

## 2018-05-20 VITALS — BP 122/80 | HR 75 | Ht 74.0 in | Wt 228.0 lb

## 2018-05-20 DIAGNOSIS — E782 Mixed hyperlipidemia: Secondary | ICD-10-CM | POA: Diagnosis not present

## 2018-05-20 DIAGNOSIS — I10 Essential (primary) hypertension: Secondary | ICD-10-CM | POA: Diagnosis not present
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
## 2018-05-20 NOTE — Telephone Encounter (Signed)
Confirmed with patient's wife that the note from today and stress test were faxed to number provided. She was grateful for assistance.

## 2018-05-20 NOTE — Patient Instructions (Signed)

## 2018-05-20 NOTE — Telephone Encounter (Signed)
New Message   Patient states nurse waiting for the Name and Number to fax records for patient and here's the information:  Charles Underwood [phone] Phone-[phone] ext. [place]

## 2018-06-20 ENCOUNTER — Ambulatory Visit (INDEPENDENT_AMBULATORY_CARE_PROVIDER_SITE_OTHER): Payer: Medicare Other | Admitting: Family Medicine

## 2018-06-20 ENCOUNTER — Other Ambulatory Visit: Payer: Self-pay

## 2018-06-20 ENCOUNTER — Encounter: Payer: Self-pay | Admitting: Family Medicine

## 2018-06-20 VITALS — BP 146/80 | HR 80 | Temp 97.8°F | Resp 16 | Ht 74.0 in | Wt 230.0 lb

## 2018-06-20 DIAGNOSIS — R0781 Pleurodynia: Secondary | ICD-10-CM | POA: Diagnosis not present

## 2018-06-20 DIAGNOSIS — M549 Dorsalgia, unspecified: Secondary | ICD-10-CM

## 2018-06-20 MED ORDER — CYCLOBENZAPRINE HCL 10 MG PO TABS: 10.0000 mg | ORAL_TABLET | Freq: Three times a day (TID) | ORAL | 0 refills | Status: DC | PRN

## 2018-06-20 MED ORDER — OXYCODONE-ACETAMINOPHEN 7.5-325 MG PO TABS: 1.0000 | ORAL_TABLET | ORAL | 0 refills | Status: DC | PRN
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
## 2018-07-04 ENCOUNTER — Other Ambulatory Visit: Payer: Self-pay | Admitting: Family Medicine

## 2018-07-04 DIAGNOSIS — M549 Dorsalgia, unspecified: Secondary | ICD-10-CM

## 2018-07-04 DIAGNOSIS — R0781 Pleurodynia: Secondary | ICD-10-CM

## 2018-07-04 MED ORDER — OXYCODONE-ACETAMINOPHEN 7.5-325 MG PO TABS: 1.0000 | ORAL_TABLET | ORAL | 0 refills | Status: DC | PRN

## 2018-07-04 NOTE — Telephone Encounter (Signed)
Patient requesting a refill on Oxycodone     LOV:  06/20/18  LRF:  06/20/18

## 2018-07-04 NOTE — Telephone Encounter (Signed)
Refill on oxycodone to [facility] [address]  °

## 2018-08-15 ENCOUNTER — Ambulatory Visit (INDEPENDENT_AMBULATORY_CARE_PROVIDER_SITE_OTHER): Payer: Medicare Other | Admitting: Family Medicine

## 2018-08-15 ENCOUNTER — Encounter: Payer: Self-pay | Admitting: Family Medicine

## 2018-08-15 ENCOUNTER — Other Ambulatory Visit: Payer: Self-pay

## 2018-08-15 VITALS — BP 134/86 | HR 64 | Temp 98.7°F | Resp 16 | Ht 74.0 in | Wt 231.0 lb

## 2018-08-15 DIAGNOSIS — R361 Hematospermia: Secondary | ICD-10-CM | POA: Diagnosis not present

## 2018-08-15 LAB — URINALYSIS, ROUTINE W REFLEX MICROSCOPIC
Bilirubin Urine: NEGATIVE
Hgb urine dipstick: NEGATIVE
Ketones, ur: NEGATIVE
Leukocytes,Ua: NEGATIVE
Nitrite: NEGATIVE
Protein, ur: NEGATIVE
Specific Gravity, Urine: 1.015 (ref 1.001–1.03)
pH: 5 (ref 5.0–8.0)
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
## 2018-10-02 ENCOUNTER — Ambulatory Visit: Payer: Medicare Other | Admitting: Family Medicine

## 2018-10-03 ENCOUNTER — Ambulatory Visit: Payer: Medicare Other | Admitting: Family Medicine

## 2018-10-08 ENCOUNTER — Telehealth: Payer: Self-pay | Admitting: Cardiovascular Disease

## 2018-10-08 NOTE — Telephone Encounter (Signed)
New message     Pt came in to today stating that the [facility] is asking for his history of his aneurisms cause he needs to have a cyst removed. I asked him if it was surgical clearance and he said no. Pt said they just needed his history. Dr. Phoebe Perch [facility] [phone]  Please call pt wife.

## 2018-10-11 NOTE — Telephone Encounter (Signed)
Several attempts have been made to call the [facility] at number and extension given to obtain fax number. I was on hold 27 minutes today before hanging up. Spoke with the patient's wife to give an update and to ask if she has a fax number. She expressed frustration as well at not being able to get through to the doctor.  MRA results printed and placed at the front desk for pick up per request to take to the doctor next time they have an appointment.  The patient's wife was grateful for assistance.

## 2018-11-11 ENCOUNTER — Ambulatory Visit (INDEPENDENT_AMBULATORY_CARE_PROVIDER_SITE_OTHER): Payer: Medicare Other | Admitting: Family Medicine

## 2018-11-11 ENCOUNTER — Encounter: Payer: Self-pay | Admitting: Family Medicine

## 2018-11-11 ENCOUNTER — Other Ambulatory Visit: Payer: Self-pay

## 2018-11-11 VITALS — BP 140/90 | HR 88 | Temp 99.2°F | Resp 16 | Ht 74.0 in | Wt 231.0 lb

## 2018-11-11 DIAGNOSIS — C73 Malignant neoplasm of thyroid gland: Secondary | ICD-10-CM
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
## 2018-12-25 ENCOUNTER — Other Ambulatory Visit: Payer: Self-pay

## 2018-12-25 ENCOUNTER — Ambulatory Visit (INDEPENDENT_AMBULATORY_CARE_PROVIDER_SITE_OTHER): Payer: Medicare Other

## 2018-12-25 DIAGNOSIS — Z23 Encounter for immunization: Secondary | ICD-10-CM

## 2019-04-11 ENCOUNTER — Ambulatory Visit: Payer: Medicare Other | Attending: Internal Medicine

## 2019-04-11 DIAGNOSIS — Z23 Encounter for immunization: Secondary | ICD-10-CM

## 2019-05-02 ENCOUNTER — Ambulatory Visit: Payer: Medicare Other | Attending: Internal Medicine

## 2019-05-02 DIAGNOSIS — Z23 Encounter for immunization: Secondary | ICD-10-CM

## 2019-05-02 NOTE — Progress Notes (Signed)
   Covid-19 Vaccination Clinic  Name:  Charles Underwood    MRN: [id number] DOB: 15-Dec-1945  05/02/2019  Mr. Charles Underwood was observed post Covid-19 immunization for 15 minutes without incidence. He was provided with Vaccine Information Sheet and instruction to access the V-Safe system.   Mr. Charles Underwood was instructed to call 911 with any severe reactions post vaccine: Marland Kitchen Difficulty breathing  . Swelling of your face and throat  . A fast heartbeat  . A bad rash all over your body  . Dizziness and weakness    Immunizations Administered    Name Date Dose VIS Date Route   Pfizer COVID-19 Vaccine 05/02/2019  9:32 AM 0.3 mL 02/28/2019 Intramuscular   Manufacturer: [place]   Lot: [id number]   [place]: [id number]

## 2019-05-21 ENCOUNTER — Encounter: Payer: Self-pay | Admitting: Physician Assistant

## 2019-05-21 ENCOUNTER — Ambulatory Visit (INDEPENDENT_AMBULATORY_CARE_PROVIDER_SITE_OTHER): Payer: Medicare Other | Admitting: Physician Assistant

## 2019-05-21 ENCOUNTER — Other Ambulatory Visit: Payer: Self-pay

## 2019-05-21 VITALS — BP 102/76 | HR 62 | Ht 74.0 in | Wt 233.0 lb

## 2019-05-21 DIAGNOSIS — E782 Mixed hyperlipidemia: Secondary | ICD-10-CM

## 2019-05-21 DIAGNOSIS — I10 Essential (primary) hypertension: Secondary | ICD-10-CM | POA: Diagnosis not present

## 2019-05-21 DIAGNOSIS — I712 Thoracic aortic aneurysm, without rupture, unspecified: Secondary | ICD-10-CM

## 2019-05-21 DIAGNOSIS — I2510 Atherosclerotic heart disease of native coronary artery without angina pectoris: Secondary | ICD-10-CM | POA: Diagnosis not present
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
## 2019-05-21 NOTE — Patient Instructions (Signed)
Medication Instructions:   Your physician recommends that you continue on your current medications as directed. Please refer to the Current Medication list given to you today.   *If you need a refill on your cardiac medications before your next appointment, please call your pharmacy*   Lab Work:  [facility]   If you have labs (blood work) drawn today and your tests are completely normal, you will receive your results only by: Marland Kitchen MyChart Message (if you have MyChart) OR . A paper copy in the mail If you have any lab test that is abnormal or we need to change your treatment, we will call you to review the results.   Testing/Procedures:   CHEST MRA  FOR THORACIC ANEURYSM     Follow-Up: At [facility], you and your health needs are our priority.  As part of our continuing mission to provide you with exceptional heart care, we have created designated Provider Care Teams.  These Care Teams include your primary Cardiologist (physician) and Advanced Practice Providers (APPs -  Physician Assistants and Nurse Practitioners) who all work together to provide you with the care you need, when you need it.  We recommend signing up for the patient portal called "MyChart".  Sign up information is provided on this After Visit Summary.  MyChart is used to connect with patients for Virtual Visits (Telemedicine).  Patients are able to view lab/test results, encounter notes, upcoming appointments, etc.  Non-urgent messages can be sent to your provider as well.   To learn more about what you can do with MyChart, go to [email or web address].    Your next appointment:   1 year(s)  The format for your next appointment:   In Person  Provider:   You may see Sherren Mocha, MD or one of the following Advanced Practice Providers on your designated Care Team:    Richardson Dopp, PA-C  [place], [place]  Daune Perch, NP    Other Instructions

## 2019-07-29 ENCOUNTER — Other Ambulatory Visit: Payer: Self-pay | Admitting: Physician Assistant

## 2019-07-29 ENCOUNTER — Telehealth: Payer: Self-pay | Admitting: Physician Assistant

## 2019-07-29 DIAGNOSIS — I712 Thoracic aortic aneurysm, without rupture, unspecified: Secondary | ICD-10-CM

## 2019-07-29 MED ORDER — DIAZEPAM 10 MG PO TABS: ORAL_TABLET | ORAL | 0 refills | Status: DC

## 2019-07-29 NOTE — Telephone Encounter (Signed)
I spoke with the patient.  He has undergone MRIs in the past without general anesthesia.  He does request something to help with anxiety.  He has PTSD and the loud noise is bothersome.  I will send in Diazepam 10 mg to take 1-2 hours before his study.  He knows that someone will have to drive him home.  MRA needs to be rescheduled.  He does NOT need general anesthesia.  I called scheduling to make them aware.  They will cancel the old MRA order.  A new MRA order has been placed.  I will send a Rx for Diazepam to his pharmacy to take on the day of the procedure.   Richardson Dopp, PA-C    07/29/2019 4:27 PM

## 2019-07-29 NOTE — Telephone Encounter (Signed)
I called and left patient a detailed message letting him know that Nicki Reaper will have scheduling call him to reschedule MRI and that Valium will be sent to pharmacy. Advised patient to call with any questions at [phone]. Ok to leave detailed message per patient DPR.

## 2019-07-29 NOTE — Telephone Encounter (Signed)
I left a message for the patient to call back.  I received a request for an H&P for radiology for his upcoming MRA under general anesthesia.  He has had a couple of MRAs in the past.  It does not look like he required any sedation for those tests.  Please make sure patient has not required general anesthesia in the past.  I think the request for general anesthesia was an error when the order was placed.  If he has not needed general anesthesia in the past, we will need to cancel the current order and put in a new order that does not require sedation.  We will also need to call central scheduling at [phone] to get the correct study scheduled.  Thanks, Richardson Dopp, PA-C    07/29/2019 11:47 AM

## 2019-08-12 ENCOUNTER — Telehealth: Payer: Self-pay | Admitting: Physician Assistant

## 2019-08-12 NOTE — Telephone Encounter (Signed)
See phone note from 07/29/2019.  His MRA was originally scheduled to be with general anesthesia.  But, this was not correct - he never required general anesthesia.  So, we canceled that MRA and rescheduled a routine MRA (without anesthesia).  So, yes, he still needs the follow up MRA.    Richardson Dopp, PA-C    08/12/2019 4:36 PM

## 2019-08-12 NOTE — Telephone Encounter (Signed)
New Message:   Wife called. She says she is confused, she  Thought pt's MRI for 08-21-19 was cancelled. She says that she received a call saying he had this appt. She wants to know if pt is still supposed to have the MRI please.gi

## 2019-08-13 ENCOUNTER — Other Ambulatory Visit: Payer: Self-pay

## 2019-08-13 DIAGNOSIS — I712 Thoracic aortic aneurysm, without rupture, unspecified: Secondary | ICD-10-CM

## 2019-08-13 NOTE — Telephone Encounter (Signed)
I called and spoke with patients wife Nelda, she is aware patient needs routine MRI and to keep appointment.

## 2019-08-19 ENCOUNTER — Ambulatory Visit: Payer: Medicare Other

## 2019-08-19 ENCOUNTER — Encounter: Payer: Self-pay | Admitting: Gastroenterology

## 2019-08-20 ENCOUNTER — Encounter: Payer: Self-pay | Admitting: Gastroenterology

## 2019-08-21 ENCOUNTER — Ambulatory Visit (HOSPITAL_COMMUNITY)
Admission: RE | Admit: 2019-08-21 | Discharge: 2019-08-21 | Disposition: A | Discharge status: Home / Self Care | Payer: Medicare Other | Source: Ambulatory Visit | Attending: Physician Assistant | Admitting: Physician Assistant

## 2019-08-21 ENCOUNTER — Ambulatory Visit (HOSPITAL_COMMUNITY): Payer: Medicare Other

## 2019-08-21 ENCOUNTER — Other Ambulatory Visit: Payer: Self-pay

## 2019-08-21 ENCOUNTER — Ambulatory Visit: Admit: 2019-08-21 | Payer: Medicare Other

## 2019-08-21 DIAGNOSIS — I712 Thoracic aortic aneurysm, without rupture, unspecified: Secondary | ICD-10-CM

## 2019-08-21 SURGERY — MRI WITH ANESTHESIA
Anesthesia: General

## 2019-08-21 MED ORDER — GADOBUTROL 1 MMOL/ML IV SOLN
10.0000 mL | Freq: Once | INTRAVENOUS | Status: AC | PRN
  Administered 2019-08-21: 10 mL via INTRAVENOUS

## 2019-08-22 ENCOUNTER — Encounter: Payer: Self-pay | Admitting: Physician Assistant

## 2019-10-02 ENCOUNTER — Ambulatory Visit (AMBULATORY_SURGERY_CENTER): Payer: Self-pay | Admitting: *Deleted

## 2019-10-02 ENCOUNTER — Other Ambulatory Visit: Payer: Self-pay

## 2019-10-02 VITALS — Ht 74.0 in | Wt 230.0 lb

## 2019-10-02 DIAGNOSIS — Z86010 Personal history of colonic polyps: Secondary | ICD-10-CM

## 2019-10-02 MED ORDER — SUTAB 1479-225-188 MG PO TABS: 1.0000 | ORAL_TABLET | Freq: Once | ORAL | 0 refills | Status: AC

## 2019-10-02 NOTE — Progress Notes (Signed)

## 2019-10-03 ENCOUNTER — Encounter: Payer: Self-pay | Admitting: Gastroenterology

## 2019-10-15 ENCOUNTER — Other Ambulatory Visit: Payer: Self-pay

## 2019-10-15 ENCOUNTER — Ambulatory Visit (AMBULATORY_SURGERY_CENTER): Payer: Medicare Other | Admitting: Gastroenterology

## 2019-10-15 ENCOUNTER — Encounter: Payer: Self-pay | Admitting: Gastroenterology

## 2019-10-15 VITALS — BP 146/84 | HR 58 | Temp 97.1°F | Resp 10 | Ht 74.0 in | Wt 230.0 lb

## 2019-10-15 DIAGNOSIS — D122 Benign neoplasm of ascending colon: Secondary | ICD-10-CM | POA: Diagnosis not present

## 2019-10-15 DIAGNOSIS — Z86010 Personal history of colonic polyps: Secondary | ICD-10-CM | POA: Diagnosis not present

## 2019-10-15 MED ORDER — SODIUM CHLORIDE 0.9 % IV SOLN: 500.0000 mL | Freq: Once | INTRAVENOUS | Status: DC

## 2019-10-15 NOTE — Progress Notes (Signed)
Called to room to assist during endoscopic procedure.  Patient ID and intended procedure confirmed with present staff. Received instructions for my participation in the procedure from the performing physician.  

## 2019-10-15 NOTE — Op Note (Signed)
[facility] Patient Name: Charles Underwood Procedure Date: 10/15/2019 1:35 PM MRN: [id number] Endoscopist: Ladene Artist , MD Age: 74 Referring MD:  Date of Birth: 09/22/1945 Gender: Male Account #: [id number] Procedure:                Colonoscopy Indications:              Surveillance: Personal history of adenomatous                            polyps on last colonoscopy 3 years ago Medicines:                Monitored Anesthesia Care Procedure:                Pre-Anesthesia Assessment:                           - Prior to the procedure, a History and Physical                            was performed, and patient medications and                            allergies were reviewed. The patient's tolerance of                            previous anesthesia was also reviewed. The risks                            and benefits of the procedure and the sedation                            options and risks were discussed with the patient.                            All questions were answered, and informed consent                            was obtained. Prior Anticoagulants: The patient has                            taken no previous anticoagulant or antiplatelet                            agents. ASA Grade Assessment: II - A patient with                            mild systemic disease. After reviewing the risks                            and benefits, the patient was deemed in                            satisfactory condition to undergo the procedure.  After obtaining informed consent, the colonoscope                            was passed under direct vision. Throughout the                            procedure, the patient's blood pressure, pulse, and                            oxygen saturations were monitored continuously. The                            Colonoscope was introduced through the anus and                            advanced to the the cecum,  identified by                            appendiceal orifice and ileocecal valve. The                            ileocecal valve, appendiceal orifice, and rectum                            were photographed. The quality of the bowel                            preparation was good. The colonoscopy was performed                            without difficulty. The patient tolerated the                            procedure well. Scope In: 1:42:59 PM Scope Out: 2:01:08 PM Scope Withdrawal Time: 0 hours 15 minutes 10 seconds  Total Procedure Duration: 0 hours 18 minutes 9 seconds  Findings:                 The perianal and digital rectal examinations were                            normal.                           Two sessile polyps were found in the ascending                            colon. The polyps were 5 to 7 mm in size. These                            polyps were removed with a cold snare. Resection                            and retrieval were complete.  Multiple medium-mouthed diverticula were found in                            the left colon. There was evidence of diverticular                            spasm. There was no evidence of diverticular                            bleeding.                           Internal hemorrhoids were found during                            retroflexion. The hemorrhoids were medium-sized and                            Grade I (internal hemorrhoids that do not prolapse).                           The exam was otherwise without abnormality on                            direct and retroflexion views. Complications:            No immediate complications. Estimated blood loss:                            None. Estimated Blood Loss:     Estimated blood loss: none. Impression:               - Two 5 to 7 mm polyps in the ascending colon,                            removed with a cold snare. Resected and retrieved.                            - Moderate diverticulosis in the left colon.                           - Internal hemorrhoids.                           - The examination was otherwise normal on direct                            and retroflexion views. Recommendation:           - Repeat colonoscopy, likely in 5 years, for                            surveillance based on pathology results.                           - Patient has a contact number available for  emergencies. The signs and symptoms of potential                            delayed complications were discussed with the                            patient. Return to normal activities tomorrow.                            Written discharge instructions were provided to the                            patient.                           - High fiber diet.                           - Continue present medications.                           - Await pathology results. Ladene Artist, MD 10/15/2019 2:06:56 PM This report has been signed electronically.

## 2019-10-15 NOTE — Progress Notes (Signed)
A/ox3, pleased with MAC, report to RN 

## 2019-10-15 NOTE — Progress Notes (Signed)
Vitals-CW  Pt's states no medical or surgical changes since previsit or office visit. 

## 2019-10-15 NOTE — Patient Instructions (Signed)
Handouts given;  Hemorrhoids, Diverticulosis, Polyps, High Fiber Diet Start high fiber diet Continue present medications Await pathology results Repeat colonoscopy in 5 years    YOU HAD AN ENDOSCOPIC PROCEDURE TODAY AT [facility]:   Refer to the procedure report that was given to you for any specific questions about what was found during the examination.  If the procedure report does not answer your questions, please call your gastroenterologist to clarify.  If you requested that your care partner not be given the details of your procedure findings, then the procedure report has been included in a sealed envelope for you to review at your convenience later.  YOU SHOULD EXPECT: Some feelings of bloating in the abdomen. Passage of more gas than usual.  Walking can help get rid of the air that was put into your GI tract during the procedure and reduce the bloating. If you had a lower endoscopy (such as a colonoscopy or flexible sigmoidoscopy) you may notice spotting of blood in your stool or on the toilet paper. If you underwent a bowel prep for your procedure, you may not have a normal bowel movement for a few days.  Please Note:  You might notice some irritation and congestion in your nose or some drainage.  This is from the oxygen used during your procedure.  There is no need for concern and it should clear up in a day or so.  SYMPTOMS TO REPORT IMMEDIATELY:   Following lower endoscopy (colonoscopy or flexible sigmoidoscopy):  Excessive amounts of blood in the stool  Significant tenderness or worsening of abdominal pains  Swelling of the abdomen that is new, acute  Fever of 100F or higher   For urgent or emergent issues, a gastroenterologist can be reached at any hour by calling [phone]. Do not use MyChart messaging for urgent concerns.    DIET:  We do recommend a small meal at first, but then you may proceed to your regular diet.  Drink plenty of fluids but you  should avoid alcoholic beverages for 24 hours.  ACTIVITY:  You should plan to take it easy for the rest of today and you should NOT DRIVE or use heavy machinery until tomorrow (because of the sedation medicines used during the test).    FOLLOW UP: Our staff will call the number listed on your records 48-72 hours following your procedure to check on you and address any questions or concerns that you may have regarding the information given to you following your procedure. If we do not reach you, we will leave a message.  We will attempt to reach you two times.  During this call, we will ask if you have developed any symptoms of COVID 19. If you develop any symptoms (ie: fever, flu-like symptoms, shortness of breath, cough etc.) before then, please call [phone].  If you test positive for Covid 19 in the 2 weeks post procedure, please call and report this information to [place].    If any biopsies were taken you will be contacted by phone or by letter within the next 1-3 weeks.  Please call us at [phone] if you have not heard about the biopsies in 3 weeks.    SIGNATURES/CONFIDENTIALITY: You and/or your care partner have signed paperwork which will be entered into your electronic medical record.  These signatures attest to the fact that that the information above on your After Visit Summary has been reviewed and is understood.  Full responsibility of the confidentiality of  this discharge information lies with you and/or your care-partner.

## 2019-10-17 ENCOUNTER — Telehealth: Payer: Self-pay

## 2019-10-17 NOTE — Telephone Encounter (Signed)
°  Follow up Call-  Call back number 10/15/2019  Post procedure Call Back phone  # [phone]  Permission to leave phone message Yes  Some recent data might be hidden     Patient questions:  Do you have a fever, pain , or abdominal swelling? No. Pain Score  0 *  Have you tolerated food without any problems? Yes.    Have you been able to return to your normal activities? Yes.    Do you have any questions about your discharge instructions: Diet   No. Medications  No. Follow up visit  No.  Do you have questions or concerns about your Care? No.  Actions: * If pain score is 4 or above: No action needed, pain <4.  Per pt he has not had a bowel movement yet.  I asked if he was uncomfortable, and pt denied any sx.  I asked he call us back if he becomes uncomfortable.  [place]  1. Have you developed a fever since your procedure? no  2.   Have you had an respiratory symptoms (SOB or cough) since your procedure? no  3.   Have you tested positive for COVID 19 since your procedure no  4.   Have you had any family members/close contacts diagnosed with the COVID 19 since your procedure?  no   If yes to any of these questions please route to Joylene John, RN and Erenest Rasher, RN

## 2019-10-26 ENCOUNTER — Encounter: Payer: Self-pay | Admitting: Gastroenterology

## 2019-11-11 ENCOUNTER — Ambulatory Visit: Payer: Medicare Other

## 2019-11-30 DIAGNOSIS — K1121 Acute sialoadenitis: Secondary | ICD-10-CM | POA: Insufficient documentation

## 2020-01-01 ENCOUNTER — Other Ambulatory Visit: Payer: Self-pay

## 2020-01-01 ENCOUNTER — Ambulatory Visit (INDEPENDENT_AMBULATORY_CARE_PROVIDER_SITE_OTHER): Payer: Medicare Other | Admitting: Family Medicine

## 2020-01-01 DIAGNOSIS — Z23 Encounter for immunization: Secondary | ICD-10-CM

## 2020-02-06 NOTE — Progress Notes (Signed)
This was a Flu vac and it was put on the provider's schedule. Could you please sign off.  Thank you

## 2020-03-26 ENCOUNTER — Ambulatory Visit: Payer: Medicare Other | Attending: Internal Medicine

## 2020-03-26 DIAGNOSIS — Z23 Encounter for immunization: Secondary | ICD-10-CM

## 2020-03-26 NOTE — Progress Notes (Signed)
   Covid-19 Vaccination Clinic  Name:  Charles Underwood    MRN: [id number] DOB: 01/17/1946  03/26/2020  [name] was observed post Covid-19 immunization for 15 minutes without incident. He was provided with Vaccine Information Sheet and instruction to access the V-Safe system.   [name] was instructed to call 911 with any severe reactions post vaccine: Marland Kitchen Difficulty breathing  . Swelling of face and throat  . A fast heartbeat  . A bad rash all over body  . Dizziness and weakness   Immunizations Administered    Name Date Dose VIS Date Route   Pfizer COVID-19 Vaccine 03/26/2020  3:53 PM 0.3 mL 01/07/2020 Intramuscular   Manufacturer: [place]   Lot: [id number]   [place]: [address]-9

## 2020-04-26 ENCOUNTER — Other Ambulatory Visit: Payer: Self-pay

## 2020-04-26 ENCOUNTER — Encounter: Payer: Self-pay | Admitting: Family Medicine

## 2020-04-26 ENCOUNTER — Ambulatory Visit (INDEPENDENT_AMBULATORY_CARE_PROVIDER_SITE_OTHER): Payer: Medicare Other | Admitting: Family Medicine

## 2020-04-26 VITALS — BP 144/86 | HR 58 | Temp 97.2°F | Ht 74.0 in | Wt 231.0 lb

## 2020-04-26 DIAGNOSIS — S81802A Unspecified open wound, left lower leg, initial encounter: Secondary | ICD-10-CM

## 2020-04-26 MED ORDER — CEPHALEXIN 500 MG PO CAPS: 500.0000 mg | ORAL_CAPSULE | Freq: Three times a day (TID) | ORAL | 0 refills | Status: DC

## 2020-04-26 MED ORDER — SILVER SULFADIAZINE 1 % EX CREA
1.0000 | TOPICAL_CREAM | Freq: Every day | CUTANEOUS | 0 refills | Status: DC
Start: 2020-04-26 — End: 2022-08-23
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
## 2020-05-20 NOTE — Progress Notes (Signed)
Cardiology Office Note:    Date:  05/21/2020   ID:  Charles Underwood, DOB 23-Nov-1945, MRN [id number]  PCP:  Charles Frizzle, MD   [place]  Cardiologist:  Charles Mocha, MD   Advanced Practice Provider:  No care team member to display Electrophysiologist:  None       Referring MD: Charles Frizzle, MD   Chief Complaint:  Follow-up (CAD, thoracic aortic aneurysm )    Patient Profile:    Charles Underwood is a 75 y.o. male with:   Coronary artery disease ? Coronary calcification by CT scan (2012) ? Myoview 10/19: low risk   Ascending aortic aneurysm  MRA 1/19: 41 mm >> repeat 03/2019  MRA 6/21: 41 mm >> rpt 08/2021  Hypertension  Diabetes mellitus  Hyperlipidemia  Sleep apnea  Thyroid CA s/p partial thyroidectomy in 2020 ([place]  Prior CV studies: Chest MRA 08/21/19 Asc thoracic aorta 4.1 cm; aortic root 4-4.2 cm >> rpt 2 years   Myoview 01/15/2018 EF 66 small apical inferior defect-?  Old infarct; no ischemia; low risk  Chest MRA 04/11/2017 Fusiform aneurysmal dilation of ascending thoracic aorta measuring 41 mm  Chest CTA 02/13/2012 Ectatic ascending aorta; proximal ascending aorta 4 cm; aortic root 3.5 cm  History of Present Illness:    Mr. Charles Underwood is a [place] Veteran ([employer], [employer]).  He was last seen in 05/2019.  He returns for f/u.  He is here alone.  He denies chest pain, shortness of breath, syncope, orthopnea, PND or significant pedal edema.       Past Medical History:  Diagnosis Date  . Adenomatous polyp of colon 03/2005  . Aortic aneurysm (HCC)    small, no issues wit this per pt.   . Asthma    as a chils, out grew  . Atherosclerosis 12/28/2010   on CTA of chest   . Atypical chest pain   . Basal cell carcinoma    basal cell face and lip  . Blood transfusion without reported diagnosis    49 years ago when shot in [place]   . CAD (coronary artery disease)   . Cervical spondylosis   . Depression  with anxiety   . Diabetes mellitus   . Diverticulosis   . Fatty infiltration of liver   . Hemorrhoids   . Hiatal hernia   . Hyperlipidemia   . Hypertension    under control  . IBS (irritable bowel syndrome)   . Nodular goiter   . Obesity   . OSA (obstructive sleep apnea)   . Sleep apnea    wears cpap  . Thoracic aortic aneurysm ([place])    Chest MRA 08/2019: Stable ascending thoracic aortic aneurysm (4.1 cm). Dilation of aortic root (4-4.2 cm).  . Thrombocytopenia (HCC)     Current Medications: Current Meds  Medication Sig  . aspirin 81 MG tablet Take 1 tablet (81 mg total) by mouth daily.  . Biotin 2500 MCG CAPS Take 1 tablet by mouth daily.  . chlorthalidone (HYGROTON) 25 MG tablet Take 12.5 mg by mouth daily.   . cholecalciferol (VITAMIN D3) 25 MCG (1000 UT) tablet Take 1,000 Units by mouth in the morning and at bedtime.   . empagliflozin (JARDIANCE) 25 MG TABS tablet Take 12.5 mg by mouth daily.  Marland Kitchen escitalopram (LEXAPRO) 10 MG tablet Take 10 mg by mouth daily.  . ferrous sulfate 325 (65 FE) MG tablet Take 325 mg by mouth daily with breakfast.  .  flunisolide (NASALIDE) 0.025 % SOLN Place 2 sprays into the nose 2 (two) times daily as needed (congestion). As needed for sinus congestion  . KRILL OIL PO Take 350 mg by mouth daily.   Marland Kitchen losartan (COZAAR) 100 MG tablet Take 50 mg by mouth 2 (two) times daily.  . metFORMIN (GLUCOPHAGE) 850 MG tablet Take 850 mg by mouth 2 (two) times daily with a meal.  . Multiple Vitamins-Minerals (ICAPS AREDS 2) CAPS Take 1 capsule by mouth 2 (two) times daily.   . potassium chloride (K-DUR,KLOR-CON) 10 MEQ tablet Take 10 mEq by mouth daily.  . rosuvastatin (CRESTOR) 20 MG tablet Take 10 mg by mouth daily.   . silver sulfADIAZINE (SILVADENE) 1 % cream Apply 1 application topically daily.  . traZODone (DESYREL) 100 MG tablet Take 150 mg by mouth at bedtime as needed for sleep.  . vitamin B-12 (CYANOCOBALAMIN) 1000 MCG tablet Take 1,000 mcg by mouth  daily.  . vitamin C (ASCORBIC ACID) 500 MG tablet Take 500 mg by mouth daily.     Allergies:   Patient has no known allergies.   Social History   Tobacco Use  . Smoking status: Never Smoker  . Smokeless tobacco: Never Used  Vaping Use  . Vaping Use: Never used  Substance Use Topics  . Alcohol use: No    Alcohol/week: 0.0 standard drinks  . Drug use: No     Family Hx: The patient's family history includes Aortic aneurysm in his father; Colon cancer in his maternal grandfather; Colon polyps in his maternal grandfather; Lung cancer in his mother. There is no history of Esophageal cancer, Stomach cancer, or Rectal cancer.  ROS   EKGs/Labs/Other Test Reviewed:    EKG:  EKG is   ordered today.  The ekg ordered today demonstrates normal sinus rhythm, HR 79, LAD, no ST-TW changes, QTc 458, no changes  Recent Labs: No results found for requested labs within last 8760 hours.   Recent Lipid Panel Lab Results  Component Value Date/Time   CHOL 108 06/15/2016 09:09 AM   TRIG 83 06/15/2016 09:09 AM   HDL 40 (L) 06/15/2016 09:09 AM   CHOLHDL 2.7 06/15/2016 09:09 AM   LDLCALC 51 06/15/2016 09:09 AM      Risk Assessment/Calculations:      Physical Exam:    VS:  BP 132/82   Pulse 79   Ht 6\' 2"  (1.88 m)   Wt 229 lb 6.4 oz (104.1 kg)   SpO2 94%   BMI 29.45 kg/m     Wt Readings from Last 3 Encounters:  05/21/20 229 lb 6.4 oz (104.1 kg)  04/26/20 231 lb (104.8 kg)  10/15/19 (!) 230 lb (104.3 kg)     Constitutional:      Appearance: Healthy appearance. Not in distress.  Neck:     Vascular: No JVR. JVD normal.  Pulmonary:     Effort: Pulmonary effort is normal.     Breath sounds: No wheezing. No rales.  Cardiovascular:     Normal rate. Regular rhythm. Normal S1. Normal S2.     Murmurs: There is no murmur.  Edema:    Peripheral edema absent.  Abdominal:     General: There is no distension.     Palpations: Abdomen is soft.  Skin:    General: Skin is warm and dry.   Neurological:     General: No focal deficit present.     Mental Status: Alert and oriented to person, place and time.  Cranial Nerves: Cranial nerves are intact.       ASSESSMENT & PLAN:    1. Coronary artery disease involving native coronary artery of native heart without angina pectoris Coronary Ca2+ by prior CT.  Myoview in 2019 neg for ischemia. He is doing well w/o angina.  Continue ASA, statin.   2. Thoracic aortic aneurysm without rupture ([place]) 41 mm on MRA in 6/21.  Repeat in 2023.   3. Essential hypertension Fair control.  Limit Na.  I have asked him to contact us if his BP runs higher.    4. Mixed hyperlipidemia Continue statin Rx.  Request recent Lipid pane from [facility].      Dispo:  Return in about 1 year (around 05/21/2021) for Routine Follow Up, w/ Dr. Burt Knack, or Richardson Dopp, PA-C, in person.   Medication Adjustments/Labs and Tests Ordered: Current medicines are reviewed at length with the patient today.  Concerns regarding medicines are outlined above.  Tests Ordered: Orders Placed This Encounter  Procedures  . EKG 12-Lead   Medication Changes: No orders of the defined types were placed in this encounter.   Signed, Richardson Dopp, PA-C  05/21/2020 11:29 AM    [facility] [address], [place], [place]  [address] Phone: [phone]; Fax: [phone]

## 2020-05-21 ENCOUNTER — Ambulatory Visit (INDEPENDENT_AMBULATORY_CARE_PROVIDER_SITE_OTHER): Payer: Medicare Other | Admitting: Physician Assistant

## 2020-05-21 ENCOUNTER — Encounter: Payer: Self-pay | Admitting: *Deleted

## 2020-05-21 ENCOUNTER — Other Ambulatory Visit: Payer: Self-pay

## 2020-05-21 ENCOUNTER — Telehealth: Payer: Self-pay | Admitting: *Deleted

## 2020-05-21 ENCOUNTER — Encounter: Payer: Self-pay | Admitting: Physician Assistant

## 2020-05-21 VITALS — BP 132/82 | HR 79 | Ht 74.0 in | Wt 229.4 lb

## 2020-05-21 DIAGNOSIS — I712 Thoracic aortic aneurysm, without rupture, unspecified: Secondary | ICD-10-CM

## 2020-05-21 DIAGNOSIS — I2510 Atherosclerotic heart disease of native coronary artery without angina pectoris: Secondary | ICD-10-CM

## 2020-05-21 DIAGNOSIS — E782 Mixed hyperlipidemia: Secondary | ICD-10-CM

## 2020-05-21 DIAGNOSIS — I10 Essential (primary) hypertension: Secondary | ICD-10-CM | POA: Diagnosis not present

## 2020-05-21 NOTE — Telephone Encounter (Signed)
S/w med rec at [facility] to request labs @ [phone].  Instructions were to write letter with labs needed and fax to [phone].

## 2020-05-21 NOTE — Patient Instructions (Signed)
Medication Instructions:  Your physician recommends that you continue on your current medications as directed. Please refer to the Current Medication list given to you today.  *If you need a refill on your cardiac medications before your next appointment, please call your pharmacy*   Lab Work: Will request labs from [facility].   If you have labs (blood work) drawn today and your tests are completely normal, you will receive your results only by: Marland Kitchen MyChart Message (if you have MyChart) OR . A paper copy in the mail If you have any lab test that is abnormal or we need to change your treatment, we will call you to review the results.   Testing/Procedures: -None   Follow-Up: At [facility], you and your health needs are our priority.  As part of our continuing mission to provide you with exceptional heart care, we have created designated Provider Care Teams.  These Care Teams include your primary Cardiologist (physician) and Advanced Practice Providers (APPs -  Physician Assistants and Nurse Practitioners) who all work together to provide you with the care you need, when you need it.  We recommend signing up for the patient portal called "MyChart".  Sign up information is provided on this After Visit Summary.  MyChart is used to connect with patients for Virtual Visits (Telemedicine).  Patients are able to view lab/test results, encounter notes, upcoming appointments, etc.  Non-urgent messages can be sent to your provider as well.   To learn more about what you can do with MyChart, go to [email or web address].    Your next appointment:   1 year(s)  The format for your next appointment:   In Person  Provider:   You may see Sherren Mocha, MD or one of the following Advanced Practice Providers on your designated Care Team:    Richardson Dopp, PA-C  Robbie Lis, [place]    Other Instructions Your physician wants you to follow-up in: 1 year with Dr.Cooper or Richardson Dopp, PA You  will receive a reminder letter in the mail two months in advance. If you don't receive a letter, please call our office to schedule the follow-up appointment.

## 2020-09-06 ENCOUNTER — Telehealth: Payer: Self-pay | Admitting: Family Medicine

## 2020-09-06 NOTE — Telephone Encounter (Signed)
Left message for patient to call back and schedule Medicare Annual Wellness Visit (AWV) in office.   If not able to come in office, please offer to do virtually or by telephone.   Last AWV: 03/16/2016  Please schedule at anytime with [facility]-Nurse Health Advisor.  If any questions, please contact me at [phone]

## 2021-02-23 DIAGNOSIS — N400 Enlarged prostate without lower urinary tract symptoms: Secondary | ICD-10-CM | POA: Insufficient documentation

## 2021-02-23 DIAGNOSIS — H35379 Puckering of macula, unspecified eye: Secondary | ICD-10-CM | POA: Insufficient documentation

## 2021-02-23 DIAGNOSIS — H2523 Age-related cataract, morgagnian type, bilateral: Secondary | ICD-10-CM | POA: Insufficient documentation

## 2021-02-23 DIAGNOSIS — F4312 Post-traumatic stress disorder, chronic: Secondary | ICD-10-CM | POA: Insufficient documentation

## 2021-02-23 DIAGNOSIS — E119 Type 2 diabetes mellitus without complications: Secondary | ICD-10-CM | POA: Insufficient documentation

## 2021-02-23 DIAGNOSIS — F32A Depression, unspecified: Secondary | ICD-10-CM | POA: Insufficient documentation

## 2021-02-23 DIAGNOSIS — H40009 Preglaucoma, unspecified, unspecified eye: Secondary | ICD-10-CM | POA: Insufficient documentation

## 2021-02-23 DIAGNOSIS — Q249 Congenital malformation of heart, unspecified: Secondary | ICD-10-CM | POA: Insufficient documentation

## 2021-02-23 DIAGNOSIS — D6869 Other thrombophilia: Secondary | ICD-10-CM | POA: Insufficient documentation

## 2021-02-24 ENCOUNTER — Ambulatory Visit (INDEPENDENT_AMBULATORY_CARE_PROVIDER_SITE_OTHER): Payer: Medicare Other

## 2021-02-24 ENCOUNTER — Other Ambulatory Visit: Payer: Self-pay

## 2021-02-24 VITALS — Ht 74.0 in | Wt 229.0 lb

## 2021-02-24 DIAGNOSIS — Z0000 Encounter for general adult medical examination without abnormal findings: Secondary | ICD-10-CM | POA: Diagnosis not present

## 2021-02-24 DIAGNOSIS — E785 Hyperlipidemia, unspecified: Secondary | ICD-10-CM | POA: Insufficient documentation

## 2021-02-24 DIAGNOSIS — R45851 Suicidal ideations: Secondary | ICD-10-CM | POA: Insufficient documentation

## 2021-02-24 DIAGNOSIS — Z049 Encounter for examination and observation for unspecified reason: Secondary | ICD-10-CM | POA: Insufficient documentation

## 2021-02-24 DIAGNOSIS — R599 Enlarged lymph nodes, unspecified: Secondary | ICD-10-CM | POA: Insufficient documentation

## 2021-02-24 DIAGNOSIS — H353190 Nonexudative age-related macular degeneration, unspecified eye, stage unspecified: Secondary | ICD-10-CM | POA: Insufficient documentation

## 2021-02-24 DIAGNOSIS — T17318A Gastric contents in larynx causing other injury, initial encounter: Secondary | ICD-10-CM | POA: Insufficient documentation

## 2021-02-24 DIAGNOSIS — W108XXA Fall (on) (from) other stairs and steps, initial encounter: Secondary | ICD-10-CM | POA: Insufficient documentation

## 2021-02-24 DIAGNOSIS — D37030 Neoplasm of uncertain behavior of the parotid salivary glands: Secondary | ICD-10-CM | POA: Insufficient documentation

## 2021-02-24 DIAGNOSIS — K219 Gastro-esophageal reflux disease without esophagitis: Secondary | ICD-10-CM | POA: Insufficient documentation

## 2021-02-24 DIAGNOSIS — I712 Thoracic aortic aneurysm, without rupture, unspecified: Secondary | ICD-10-CM | POA: Insufficient documentation

## 2021-02-24 DIAGNOSIS — K5730 Diverticulosis of large intestine without perforation or abscess without bleeding: Secondary | ICD-10-CM | POA: Insufficient documentation

## 2021-02-24 DIAGNOSIS — N529 Male erectile dysfunction, unspecified: Secondary | ICD-10-CM | POA: Insufficient documentation

## 2021-02-24 DIAGNOSIS — Z659 Problem related to unspecified psychosocial circumstances: Secondary | ICD-10-CM | POA: Insufficient documentation

## 2021-02-24 DIAGNOSIS — M25559 Pain in unspecified hip: Secondary | ICD-10-CM | POA: Insufficient documentation

## 2021-02-24 DIAGNOSIS — R69 Illness, unspecified: Secondary | ICD-10-CM | POA: Insufficient documentation

## 2021-02-24 DIAGNOSIS — Z9189 Other specified personal risk factors, not elsewhere classified: Secondary | ICD-10-CM | POA: Insufficient documentation

## 2021-02-24 DIAGNOSIS — C73 Malignant neoplasm of thyroid gland: Secondary | ICD-10-CM | POA: Insufficient documentation

## 2021-02-24 DIAGNOSIS — R5383 Other fatigue: Secondary | ICD-10-CM | POA: Insufficient documentation

## 2021-02-24 DIAGNOSIS — G4733 Obstructive sleep apnea (adult) (pediatric): Secondary | ICD-10-CM | POA: Insufficient documentation

## 2021-02-24 DIAGNOSIS — J309 Allergic rhinitis, unspecified: Secondary | ICD-10-CM | POA: Insufficient documentation

## 2021-02-24 DIAGNOSIS — E1140 Type 2 diabetes mellitus with diabetic neuropathy, unspecified: Secondary | ICD-10-CM | POA: Insufficient documentation

## 2021-02-24 DIAGNOSIS — I259 Chronic ischemic heart disease, unspecified: Secondary | ICD-10-CM | POA: Insufficient documentation

## 2021-02-24 NOTE — Patient Instructions (Signed)
Charles Underwood , Thank you for taking time to come for your Medicare Wellness Visit. I appreciate your ongoing commitment to your health goals. Please review the following plan we discussed and let me know if I can assist you in the future.   Screening recommendations/referrals: Colonoscopy: Done 10/15/2019 Repeat in 5 years  Recommended yearly ophthalmology/optometry visit for glaucoma screening and checkup Recommended yearly dental visit for hygiene and checkup  Vaccinations: Influenza vaccine: Done 12/20/2020 Repeat annually  Pneumococcal vaccine: Done 03/20/2012 and 03/20/2013 Tdap vaccine: Done 01/04/2018 Shingles vaccine: Done 03/31/2011, 03/24/2019 and 08/29/2019   Covid-19: Done 04/11/2019, 05/02/2019 and 04/11/2019  Advanced directives: Please bring a copy of your health care power of attorney and living will to the office to be added to your chart at your convenience.   Conditions/risks identified: Aim for 30 minutes of exercise or brisk walking each day, drink 6-8 glasses of water and eat lots of fruits and vegetables.   Next appointment: Follow up in one year for your annual wellness visit. 2023.  Preventive Care 76 Years and Older, Male  Preventive care refers to lifestyle choices and visits with your health care provider that can promote health and wellness. What does preventive care include? A yearly physical exam. This is also called an annual well check. Dental exams once or twice a year. Routine eye exams. Ask your health care provider how often you should have your eyes checked. Personal lifestyle choices, including: Daily care of your teeth and gums. Regular physical activity. Eating a healthy diet. Avoiding tobacco and drug use. Limiting alcohol use. Practicing safe sex. Taking low doses of aspirin every day. Taking vitamin and mineral supplements as recommended by your health care provider. What happens during an annual well check? The services and screenings done by  your health care provider during your annual well check will depend on your age, overall health, lifestyle risk factors, and family history of disease. Counseling  Your health care provider may ask you questions about your: Alcohol use. Tobacco use. Drug use. Emotional well-being. Home and relationship well-being. Sexual activity. Eating habits. History of falls. Memory and ability to understand (cognition). Work and work Statistician. Screening  You may have the following tests or measurements: Height, weight, and BMI. Blood pressure. Lipid and cholesterol levels. These may be checked every 5 years, or more frequently if you are over 60 years old. Skin check. Lung cancer screening. You may have this screening every year starting at age 76 if you have a 30-pack-year history of smoking and currently smoke or have quit within the past 15 years. Fecal occult blood test (FOBT) of the stool. You may have this test every year starting at age 76. Flexible sigmoidoscopy or colonoscopy. You may have a sigmoidoscopy every 5 years or a colonoscopy every 10 years starting at age 76. Prostate cancer screening. Recommendations will vary depending on your family history and other risks. Hepatitis C blood test. Hepatitis B blood test. Sexually transmitted disease (STD) testing. Diabetes screening. This is done by checking your blood sugar (glucose) after you have not eaten for a while (fasting). You may have this done every 1-3 years. Abdominal aortic aneurysm (AAA) screening. You may need this if you are a current or former smoker. Osteoporosis. You may be screened starting at age 76 if you are at high risk. Talk with your health care provider about your test results, treatment options, and if necessary, the need for more tests. Vaccines  Your health care provider may recommend  certain vaccines, such as: Influenza vaccine. This is recommended every year. Tetanus, diphtheria, and acellular pertussis  (Tdap, Td) vaccine. You may need a Td booster every 10 years. Zoster vaccine. You may need this after age 76. Pneumococcal 13-valent conjugate (PCV13) vaccine. One dose is recommended after age 76. Pneumococcal polysaccharide (PPSV23) vaccine. One dose is recommended after age 76. Talk to your health care provider about which screenings and vaccines you need and how often you need them. This information is not intended to replace advice given to you by your health care provider. Make sure you discuss any questions you have with your health care provider. Document Released: 04/02/2015 Document Revised: 11/24/2015 Document Reviewed: 01/05/2015 Elsevier Interactive Patient Education  2017 [employer] Prevention in the Home Falls can cause injuries. They can happen to people of all ages. There are many things you can do to make your home safe and to help prevent falls. What can I do on the outside of my home? Regularly fix the edges of walkways and driveways and fix any cracks. Remove anything that might make you trip as you walk through a door, such as a raised step or threshold. Trim any bushes or trees on the path to your home. Use bright outdoor lighting. Clear any walking paths of anything that might make someone trip, such as rocks or tools. Regularly check to see if handrails are loose or broken. Make sure that both sides of any steps have handrails. Any raised decks and porches should have guardrails on the edges. Have any leaves, snow, or ice cleared regularly. Use sand or salt on walking paths during winter. Clean up any spills in your garage right away. This includes oil or grease spills. What can I do in the bathroom? Use night lights. Install grab bars by the toilet and in the tub and shower. Do not use towel bars as grab bars. Use non-skid mats or decals in the tub or shower. If you need to sit down in the shower, use a plastic, non-slip stool. Keep the floor dry. Clean  up any water that spills on the floor as soon as it happens. Remove soap buildup in the tub or shower regularly. Attach bath mats securely with double-sided non-slip rug tape. Do not have throw rugs and other things on the floor that can make you trip. What can I do in the bedroom? Use night lights. Make sure that you have a light by your bed that is easy to reach. Do not use any sheets or blankets that are too big for your bed. They should not hang down onto the floor. Have a firm chair that has side arms. You can use this for support while you get dressed. Do not have throw rugs and other things on the floor that can make you trip. What can I do in the kitchen? Clean up any spills right away. Avoid walking on wet floors. Keep items that you use a lot in easy-to-reach places. If you need to reach something above you, use a strong step stool that has a grab bar. Keep electrical cords out of the way. Do not use floor polish or wax that makes floors slippery. If you must use wax, use non-skid floor wax. Do not have throw rugs and other things on the floor that can make you trip. What can I do with my stairs? Do not leave any items on the stairs. Make sure that there are handrails on both sides of the  stairs and use them. Fix handrails that are broken or loose. Make sure that handrails are as long as the stairways. Check any carpeting to make sure that it is firmly attached to the stairs. Fix any carpet that is loose or worn. Avoid having throw rugs at the top or bottom of the stairs. If you do have throw rugs, attach them to the floor with carpet tape. Make sure that you have a light switch at the top of the stairs and the bottom of the stairs. If you do not have them, ask someone to add them for you. What else can I do to help prevent falls? Wear shoes that: Do not have high heels. Have rubber bottoms. Are comfortable and fit you well. Are closed at the toe. Do not wear sandals. If you  use a stepladder: Make sure that it is fully opened. Do not climb a closed stepladder. Make sure that both sides of the stepladder are locked into place. Ask someone to hold it for you, if possible. Clearly mark and make sure that you can see: Any grab bars or handrails. First and last steps. Where the edge of each step is. Use tools that help you move around (mobility aids) if they are needed. These include: Canes. Walkers. Scooters. Crutches. Turn on the lights when you go into a dark area. Replace any light bulbs as soon as they burn out. Set up your furniture so you have a clear path. Avoid moving your furniture around. If any of your floors are uneven, fix them. If there are any pets around you, be aware of where they are. Review your medicines with your doctor. Some medicines can make you feel dizzy. This can increase your chance of falling. Ask your doctor what other things that you can do to help prevent falls. This information is not intended to replace advice given to you by your health care provider. Make sure you discuss any questions you have with your health care provider. Document Released: 12/31/2008 Document Revised: 08/12/2015 Document Reviewed: 04/10/2014 Elsevier Interactive Patient Education  2017 [employer].
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
## 2021-03-29 HISTORY — PX: OTHER SURGICAL HISTORY: SHX169

## 2021-07-29 ENCOUNTER — Encounter: Payer: Self-pay | Admitting: Cardiovascular Disease

## 2021-07-29 ENCOUNTER — Ambulatory Visit (INDEPENDENT_AMBULATORY_CARE_PROVIDER_SITE_OTHER): Payer: Medicare Other | Admitting: Cardiovascular Disease

## 2021-07-29 VITALS — BP 130/80 | HR 75 | Ht 74.0 in | Wt 232.2 lb

## 2021-07-29 DIAGNOSIS — I7121 Aneurysm of the ascending aorta, without rupture: Secondary | ICD-10-CM

## 2021-07-29 DIAGNOSIS — E782 Mixed hyperlipidemia: Secondary | ICD-10-CM | POA: Diagnosis not present

## 2021-07-29 DIAGNOSIS — I10 Essential (primary) hypertension: Secondary | ICD-10-CM

## 2021-07-29 DIAGNOSIS — I25119 Atherosclerotic heart disease of native coronary artery with unspecified angina pectoris: Secondary | ICD-10-CM

## 2021-07-29 DIAGNOSIS — R079 Chest pain, unspecified: Secondary | ICD-10-CM

## 2021-07-29 DIAGNOSIS — Z01810 Encounter for preprocedural cardiovascular examination: Secondary | ICD-10-CM

## 2021-07-29 LAB — BASIC METABOLIC PANEL
BUN/Creatinine Ratio: 20 (ref 10–24)
BUN: 23 mg/dL (ref 8–27)
CO2: 25 mmol/L (ref 20–29)
Calcium: 9.5 mg/dL (ref 8.6–10.2)
Chloride: 95 mmol/L — ABNORMAL LOW (ref 96–106)
Creatinine, Ser: 1.13 mg/dL (ref 0.76–1.27)
Glucose: 132 mg/dL — ABNORMAL HIGH (ref 70–99)
Potassium: 3.8 mmol/L (ref 3.5–5.2)
Sodium: 136 mmol/L (ref 134–144)
eGFR: 67 mL/min/{1.73_m2} (ref >59)

## 2021-07-29 MED ORDER — METOPROLOL TARTRATE 100 MG PO TABS: ORAL_TABLET | ORAL | 0 refills | Status: DC

## 2021-07-29 MED ORDER — METOPROLOL TARTRATE 100 MG PO TABS
ORAL_TABLET | ORAL | 0 refills | Status: DC
Start: 2021-07-29 — End: 2022-11-07

## 2021-07-29 NOTE — Patient Instructions (Addendum)
Medication Instructions:  ?Your physician recommends that you continue on your current medications as directed. Please refer to the Current Medication list given to you today. ? ?*If you need a refill on your cardiac medications before your next appointment, please call your pharmacy* ? ? ?Lab Work: ?BMET today ?If you have labs (blood work) drawn today and your tests are completely normal, you will receive your results only by: ?MyChart Message (if you have MyChart) OR ?A paper copy in the mail ?If you have any lab test that is abnormal or we need to change your treatment, we will call you to review the results. ? ? ?Testing/Procedures: ?Coronary CTA ?Your physician has requested that you have cardiac CT. Cardiac computed tomography (CT) is a painless test that uses an x-ray machine to take clear, detailed pictures of your heart. For further information please visit [email or web address]. Please follow instruction sheet as given. ? ?Follow-Up: ?At [facility], you and your health needs are our priority.  As part of our continuing mission to provide you with exceptional heart care, we have created designated Provider Care Teams.  These Care Teams include your primary Cardiologist (physician) and Advanced Practice Providers (APPs -  Physician Assistants and Nurse Practitioners) who all work together to provide you with the care you need, when you need it. ? ? ?Your next appointment:   ?1 year(s) ? ?The format for your next appointment:   ?In Person ? ?Provider:   ?Sherren Mocha, MD   ? ? ?Other Instructions ? ? ?Your cardiac CT will be scheduled at one of the below locations:  ? ?[facility] ?[address] ?[place], [place] [address] ?[phone] ? ?Please arrive at the [facility] and Children's Entrance (Entrance C2) of [facility] 30 minutes prior to test start time. ?You can use the FREE valet parking offered at entrance C (encouraged to control the heart rate for the test)  ?Proceed to the  [facility] Radiology Department (first floor) to check-in and test prep. ? ?All radiology patients and guests should use entrance C2 at [facility], accessed from [facility], even though the hospital's physical address listed is [address] ? ? ? ?Please follow these instructions carefully (unless otherwise directed): ? ?Hold all erectile dysfunction medications at least 3 days (72 hrs) prior to test. ? ?On the Night Before the Test: ?Be sure to Drink plenty of water. ?Do not consume any caffeinated/decaffeinated beverages or chocolate 12 hours prior to your test. ?Do not take any antihistamines 12 hours prior to your test. ? ?On the Day of the Test: ?Drink plenty of water until 1 hour prior to the test. ?Do not eat any food 4 hours prior to the test. ?You may take your regular medications prior to the test.  ?Take metoprolol (Lopressor) two hours prior to test. ? ?After the Test: ?Drink plenty of water. ?After receiving IV contrast, you may experience a mild flushed feeling. This is normal. ?On occasion, you may experience a mild rash up to 24 hours after the test. This is not dangerous. If this occurs, you can take Benadryl 25 mg and increase your fluid intake. ?If you experience trouble breathing, this can be serious. If it is severe call 911 IMMEDIATELY. If it is mild, please call our office. ?If you take any of these medications: Metformin please do not take 48 hours after completing test unless otherwise instructed. ? ?We will call to schedule your test 2-4 weeks out understanding  that some insurance companies will need an authorization prior to the service being performed.  ? ?For non-scheduling related questions, please contact the cardiac imaging nurse navigator should you have any questions/concerns: ?Marchia Bond, Cardiac Imaging Nurse Navigator ?Gordy Clement, Cardiac Imaging Nurse Navigator ?[facility] ?Direct Office Dial: [phone]   ? ?For scheduling needs, including cancellations and rescheduling, please call [place], [phone]. ? ?  ? ?Important Information About Sugar ? ? ? ? ?  ?
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
## 2021-08-16 ENCOUNTER — Telehealth (HOSPITAL_COMMUNITY): Payer: Self-pay | Admitting: *Deleted

## 2021-08-16 ENCOUNTER — Other Ambulatory Visit (HOSPITAL_COMMUNITY): Payer: Self-pay | Admitting: *Deleted

## 2021-08-16 DIAGNOSIS — I7121 Aneurysm of the ascending aorta, without rupture: Secondary | ICD-10-CM

## 2021-08-16 NOTE — Telephone Encounter (Signed)
Patient's wife calling regarding upcoming cardiac imaging study; pt's wife verbalizes understanding of appt date/time, parking situation and where to check in, pre-test NPO status and medications ordered, and verified current allergies; name and call back number provided for further questions should they arise  Gordy Clement RN Navigator Cardiac Imaging [facility] [phone] office [phone] cell  Patient to take '100mg'$  metoprolol tartrate two hours prior to his cardiac CT scan.  They are aware to arrive at 10am.

## 2021-08-17 ENCOUNTER — Other Ambulatory Visit: Payer: Self-pay | Admitting: Internal Medicine

## 2021-08-17 ENCOUNTER — Ambulatory Visit (HOSPITAL_COMMUNITY)
Admission: RE | Admit: 2021-08-17 | Discharge: 2021-08-17 | Disposition: A | Discharge status: Home / Self Care | Payer: Medicare Other | Source: Ambulatory Visit | Attending: Internal Medicine | Admitting: Internal Medicine

## 2021-08-17 ENCOUNTER — Ambulatory Visit (HOSPITAL_COMMUNITY)
Admission: RE | Admit: 2021-08-17 | Discharge: 2021-08-17 | Disposition: A | Discharge status: Home / Self Care | Payer: Medicare Other | Source: Ambulatory Visit | Attending: Cardiovascular Disease | Admitting: Cardiovascular Disease

## 2021-08-17 ENCOUNTER — Ambulatory Visit (HOSPITAL_BASED_OUTPATIENT_CLINIC_OR_DEPARTMENT_OTHER)
Admission: RE | Admit: 2021-08-17 | Discharge: 2021-08-17 | Disposition: A | Discharge status: Home / Self Care | Payer: Medicare Other | Source: Ambulatory Visit | Attending: Internal Medicine | Admitting: Internal Medicine

## 2021-08-17 DIAGNOSIS — I7121 Aneurysm of the ascending aorta, without rupture: Secondary | ICD-10-CM | POA: Diagnosis present

## 2021-08-17 DIAGNOSIS — R931 Abnormal findings on diagnostic imaging of heart and coronary circulation: Secondary | ICD-10-CM

## 2021-08-17 DIAGNOSIS — I517 Cardiomegaly: Secondary | ICD-10-CM | POA: Diagnosis not present

## 2021-08-17 DIAGNOSIS — I2510 Atherosclerotic heart disease of native coronary artery without angina pectoris: Secondary | ICD-10-CM | POA: Diagnosis not present

## 2021-08-17 DIAGNOSIS — R079 Chest pain, unspecified: Secondary | ICD-10-CM | POA: Diagnosis present

## 2021-08-17 MED ORDER — NITROGLYCERIN 0.4 MG SL SUBL
0.8000 mg | SUBLINGUAL_TABLET | Freq: Once | SUBLINGUAL | Status: AC
  Administered 2021-08-17: 0.8 mg via SUBLINGUAL

## 2021-08-17 MED ORDER — NITROGLYCERIN 0.4 MG SL SUBL
SUBLINGUAL_TABLET | SUBLINGUAL | Status: AC
  Filled 2021-08-17: qty 2

## 2021-08-17 MED ORDER — IOHEXOL 350 MG/ML SOLN
100.0000 mL | Freq: Once | INTRAVENOUS | Status: AC | PRN
  Administered 2021-08-17: 100 mL via INTRAVENOUS

## 2021-08-18 ENCOUNTER — Ambulatory Visit (HOSPITAL_COMMUNITY): Payer: Medicare Other

## 2021-08-18 ENCOUNTER — Encounter: Payer: Self-pay | Admitting: Cardiovascular Disease

## 2021-08-18 ENCOUNTER — Telehealth: Payer: Self-pay

## 2021-08-18 DIAGNOSIS — I7121 Aneurysm of the ascending aorta, without rupture: Secondary | ICD-10-CM

## 2021-08-18 NOTE — Telephone Encounter (Signed)
-----   Message from Sherren Mocha, MD sent at 08/18/2021  8:50 AM EDT ----- Study reviewed.  There is diffuse coronary plaque but no significant stenosis is identified.  The study result was sent off to assess blood flow to the heart and all of this look normal.  Need to continue with aggressive risk factor modification, good diet and exercise plan, and aim for an LDL cholesterol less than 70 mg/dL.  The patient's aortic aneurysm shows a modest size increased compared to previous MRA studies.  Some of this may be related to different imaging modalities.  I recommend a repeat CTA of the chest (gated scan) in 1 year for follow-up.  Thanks

## 2021-08-18 NOTE — Telephone Encounter (Signed)
Spoke with patient who understands and agrees with plan. Order placed at this time for repeat gated CTA in one year to assess size of aneurysm.

## 2021-08-18 NOTE — Telephone Encounter (Signed)
Done-see telephone note from today

## 2021-11-10 ENCOUNTER — Other Ambulatory Visit: Payer: Self-pay | Admitting: Cardiovascular Disease

## 2021-12-20 ENCOUNTER — Encounter: Payer: Self-pay | Admitting: Family Medicine

## 2022-01-25 ENCOUNTER — Telehealth: Payer: Self-pay | Admitting: Family Medicine

## 2022-01-25 NOTE — Telephone Encounter (Signed)
Left message to return call to reschedule Medicare AWV appt currently schedled for Thurs 03/02/22; schedule template for [facility] has changed.

## 2022-03-02 ENCOUNTER — Ambulatory Visit (INDEPENDENT_AMBULATORY_CARE_PROVIDER_SITE_OTHER): Payer: Medicare Other

## 2022-03-02 VITALS — Ht 74.0 in | Wt 230.0 lb

## 2022-03-02 DIAGNOSIS — Z0000 Encounter for general adult medical examination without abnormal findings: Secondary | ICD-10-CM

## 2022-03-02 NOTE — Patient Instructions (Signed)
Charles Underwood , Thank you for taking time to come for your Medicare Wellness Visit. I appreciate your ongoing commitment to your health goals. Please review the following plan we discussed and let me know if I can assist you in the future.   These are the goals we discussed:  Goals      Exercise 3x per week (30 min per time)     Increase exercise.        This is a list of the screening recommended for you and due dates:  Health Maintenance  Topic Date Due   Complete foot exam   Never done   Eye exam for diabetics  Never done   Yearly kidney health urinalysis for diabetes  Never done   Hemoglobin A1C  04/12/2018   COVID-19 Vaccine (4 - 2023-24 season) 11/18/2021   Yearly kidney function blood test for diabetes  07/30/2022   Medicare Annual Wellness Visit  03/03/2023   Colon Cancer Screening  10/14/2024   DTaP/Tdap/Td vaccine (4 - Td or Tdap) 07/01/2031   Pneumonia Vaccine  Completed   Flu Shot  Completed   Hepatitis C Screening: USPSTF Recommendation to screen - Ages 18-79 yo.  Completed   Zoster (Shingles) Vaccine  Completed   HPV Vaccine  Aged Out    Advanced directives: Please bring a copy of your health care power of attorney and living will to the office to be added to your chart at your convenience.   Conditions/risks identified: Aim for 30 minutes of exercise or brisk walking, 6-8 glasses of water, and 5 servings of fruits and vegetables each day.   Next appointment: Follow up in one year for your annual wellness visit.   Preventive Care 52 Years and Older, Male  Preventive care refers to lifestyle choices and visits with your health care provider that can promote health and wellness. What does preventive care include? A yearly physical exam. This is also called an annual well check. Dental exams once or twice a year. Routine eye exams. Ask your health care provider how often you should have your eyes checked. Personal lifestyle choices, including: Daily care of your  teeth and gums. Regular physical activity. Eating a healthy diet. Avoiding tobacco and drug use. Limiting alcohol use. Practicing safe sex. Taking low doses of aspirin every day. Taking vitamin and mineral supplements as recommended by your health care provider. What happens during an annual well check? The services and screenings done by your health care provider during your annual well check will depend on your age, overall health, lifestyle risk factors, and family history of disease. Counseling  Your health care provider may ask you questions about your: Alcohol use. Tobacco use. Drug use. Emotional well-being. Home and relationship well-being. Sexual activity. Eating habits. History of falls. Memory and ability to understand (cognition). Work and work Statistician. Screening  You may have the following tests or measurements: Height, weight, and BMI. Blood pressure. Lipid and cholesterol levels. These may be checked every 5 years, or more frequently if you are over 52 years old. Skin check. Lung cancer screening. You may have this screening every year starting at age 23 if you have a 30-pack-year history of smoking and currently smoke or have quit within the past 15 years. Fecal occult blood test (FOBT) of the stool. You may have this test every year starting at age 74. Flexible sigmoidoscopy or colonoscopy. You may have a sigmoidoscopy every 5 years or a colonoscopy every 10 years starting at age 63.  Prostate cancer screening. Recommendations will vary depending on your family history and other risks. Hepatitis C blood test. Hepatitis B blood test. Sexually transmitted disease (STD) testing. Diabetes screening. This is done by checking your blood sugar (glucose) after you have not eaten for a while (fasting). You may have this done every 1-3 years. Abdominal aortic aneurysm (AAA) screening. You may need this if you are a current or former smoker. Osteoporosis. You may be  screened starting at age 66 if you are at high risk. Talk with your health care provider about your test results, treatment options, and if necessary, the need for more tests. Vaccines  Your health care provider may recommend certain vaccines, such as: Influenza vaccine. This is recommended every year. Tetanus, diphtheria, and acellular pertussis (Tdap, Td) vaccine. You may need a Td booster every 10 years. Zoster vaccine. You may need this after age 74. Pneumococcal 13-valent conjugate (PCV13) vaccine. One dose is recommended after age 69. Pneumococcal polysaccharide (PPSV23) vaccine. One dose is recommended after age 57. Talk to your health care provider about which screenings and vaccines you need and how often you need them. This information is not intended to replace advice given to you by your health care provider. Make sure you discuss any questions you have with your health care provider. Document Released: 04/02/2015 Document Revised: 11/24/2015 Document Reviewed: 01/05/2015 Elsevier Interactive Patient Education  2017 [place] Prevention in the Home Falls can cause injuries. They can happen to people of all ages. There are many things you can do to make your home safe and to help prevent falls. What can I do on the outside of my home? Regularly fix the edges of walkways and driveways and fix any cracks. Remove anything that might make you trip as you walk through a door, such as a raised step or threshold. Trim any bushes or trees on the path to your home. Use bright outdoor lighting. Clear any walking paths of anything that might make someone trip, such as rocks or tools. Regularly check to see if handrails are loose or broken. Make sure that both sides of any steps have handrails. Any raised decks and porches should have guardrails on the edges. Have any leaves, snow, or ice cleared regularly. Use sand or salt on walking paths during winter. Clean up any spills in  your garage right away. This includes oil or grease spills. What can I do in the bathroom? Use night lights. Install grab bars by the toilet and in the tub and shower. Do not use towel bars as grab bars. Use non-skid mats or decals in the tub or shower. If you need to sit down in the shower, use a plastic, non-slip stool. Keep the floor dry. Clean up any water that spills on the floor as soon as it happens. Remove soap buildup in the tub or shower regularly. Attach bath mats securely with double-sided non-slip rug tape. Do not have throw rugs and other things on the floor that can make you trip. What can I do in the bedroom? Use night lights. Make sure that you have a light by your bed that is easy to reach. Do not use any sheets or blankets that are too big for your bed. They should not hang down onto the floor. Have a firm chair that has side arms. You can use this for support while you get dressed. Do not have throw rugs and other things on the floor that can make you trip.  What can I do in the kitchen? Clean up any spills right away. Avoid walking on wet floors. Keep items that you use a lot in easy-to-reach places. If you need to reach something above you, use a strong step stool that has a grab bar. Keep electrical cords out of the way. Do not use floor polish or wax that makes floors slippery. If you must use wax, use non-skid floor wax. Do not have throw rugs and other things on the floor that can make you trip. What can I do with my stairs? Do not leave any items on the stairs. Make sure that there are handrails on both sides of the stairs and use them. Fix handrails that are broken or loose. Make sure that handrails are as long as the stairways. Check any carpeting to make sure that it is firmly attached to the stairs. Fix any carpet that is loose or worn. Avoid having throw rugs at the top or bottom of the stairs. If you do have throw rugs, attach them to the floor with carpet  tape. Make sure that you have a light switch at the top of the stairs and the bottom of the stairs. If you do not have them, ask someone to add them for you. What else can I do to help prevent falls? Wear shoes that: Do not have high heels. Have rubber bottoms. Are comfortable and fit you well. Are closed at the toe. Do not wear sandals. If you use a stepladder: Make sure that it is fully opened. Do not climb a closed stepladder. Make sure that both sides of the stepladder are locked into place. Ask someone to hold it for you, if possible. Clearly mark and make sure that you can see: Any grab bars or handrails. First and last steps. Where the edge of each step is. Use tools that help you move around (mobility aids) if they are needed. These include: Canes. Walkers. Scooters. Crutches. Turn on the lights when you go into a dark area. Replace any light bulbs as soon as they burn out. Set up your furniture so you have a clear path. Avoid moving your furniture around. If any of your floors are uneven, fix them. If there are any pets around you, be aware of where they are. Review your medicines with your doctor. Some medicines can make you feel dizzy. This can increase your chance of falling. Ask your doctor what other things that you can do to help prevent falls. This information is not intended to replace advice given to you by your health care provider. Make sure you discuss any questions you have with your health care provider. Document Released: 12/31/2008 Document Revised: 08/12/2015 Document Reviewed: 04/10/2014 Elsevier Interactive Patient Education  2017 [employer].
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
## 2022-03-07 ENCOUNTER — Emergency Department (HOSPITAL_BASED_OUTPATIENT_CLINIC_OR_DEPARTMENT_OTHER): Payer: No Typology Code available for payment source

## 2022-03-07 ENCOUNTER — Encounter (HOSPITAL_BASED_OUTPATIENT_CLINIC_OR_DEPARTMENT_OTHER): Payer: Self-pay | Admitting: Emergency Medicine

## 2022-03-07 ENCOUNTER — Emergency Department (HOSPITAL_BASED_OUTPATIENT_CLINIC_OR_DEPARTMENT_OTHER)
Admission: EM | Admit: 2022-03-07 | Discharge: 2022-03-08 | Disposition: A | Discharge status: Home / Self Care | Payer: No Typology Code available for payment source | Attending: Emergency Medicine | Admitting: Emergency Medicine

## 2022-03-07 ENCOUNTER — Other Ambulatory Visit: Payer: Self-pay

## 2022-03-07 DIAGNOSIS — I440 Atrioventricular block, first degree: Secondary | ICD-10-CM | POA: Diagnosis not present

## 2022-03-07 DIAGNOSIS — R269 Unspecified abnormalities of gait and mobility: Secondary | ICD-10-CM

## 2022-03-07 DIAGNOSIS — I10 Essential (primary) hypertension: Secondary | ICD-10-CM | POA: Diagnosis not present

## 2022-03-07 DIAGNOSIS — J45909 Unspecified asthma, uncomplicated: Secondary | ICD-10-CM | POA: Insufficient documentation

## 2022-03-07 DIAGNOSIS — Z79899 Other long term (current) drug therapy: Secondary | ICD-10-CM | POA: Insufficient documentation

## 2022-03-07 DIAGNOSIS — Z1152 Encounter for screening for COVID-19: Secondary | ICD-10-CM | POA: Insufficient documentation

## 2022-03-07 DIAGNOSIS — E119 Type 2 diabetes mellitus without complications: Secondary | ICD-10-CM | POA: Insufficient documentation

## 2022-03-07 DIAGNOSIS — Z7984 Long term (current) use of oral hypoglycemic drugs: Secondary | ICD-10-CM | POA: Diagnosis not present

## 2022-03-07 DIAGNOSIS — I2510 Atherosclerotic heart disease of native coronary artery without angina pectoris: Secondary | ICD-10-CM | POA: Diagnosis not present

## 2022-03-07 DIAGNOSIS — J101 Influenza due to other identified influenza virus with other respiratory manifestations: Secondary | ICD-10-CM | POA: Diagnosis not present

## 2022-03-07 DIAGNOSIS — Z7982 Long term (current) use of aspirin: Secondary | ICD-10-CM | POA: Diagnosis not present

## 2022-03-07 DIAGNOSIS — R2990 Unspecified symptoms and signs involving the nervous system: Secondary | ICD-10-CM | POA: Diagnosis not present

## 2022-03-07 LAB — APTT: aPTT: 27 seconds (ref 24–36)

## 2022-03-07 LAB — CBC
HCT: 47.7 % (ref 39.0–52.0)
Hemoglobin: 16 g/dL (ref 13.0–17.0)
MCH: 29.3 pg (ref 26.0–34.0)
MCHC: 33.5 g/dL (ref 30.0–36.0)
MCV: 87.4 fL (ref 80.0–100.0)
Platelets: 125 10*3/uL — ABNORMAL LOW (ref 150–400)
RBC: 5.46 MIL/uL (ref 4.22–5.81)
RDW: 13.7 % (ref 11.5–15.5)
WBC: 3.1 10*3/uL — ABNORMAL LOW (ref 4.0–10.5)
nRBC: 0 % (ref 0.0–0.2)

## 2022-03-07 LAB — DIFFERENTIAL
Abs Immature Granulocytes: 0.01 10*3/uL (ref 0.00–0.07)
Basophils Absolute: 0 10*3/uL (ref 0.0–0.1)
Basophils Relative: 0 %
Eosinophils Absolute: 0.1 10*3/uL (ref 0.0–0.5)
Eosinophils Relative: 3 %
Immature Granulocytes: 0 %
Lymphocytes Relative: 28 %
Lymphs Abs: 0.9 10*3/uL (ref 0.7–4.0)
Monocytes Absolute: 0.5 10*3/uL (ref 0.1–1.0)
Monocytes Relative: 16 %
Neutro Abs: 1.7 10*3/uL (ref 1.7–7.7)
Neutrophils Relative %: 53 %

## 2022-03-07 LAB — COMPREHENSIVE METABOLIC PANEL
ALT: 18 U/L (ref 0–44)
AST: 18 U/L (ref 15–41)
Albumin: 4.4 g/dL (ref 3.5–5.0)
Alkaline Phosphatase: 65 U/L (ref 38–126)
Anion gap: 11 (ref 5–15)
BUN: 21 mg/dL (ref 8–23)
CO2: 27 mmol/L (ref 22–32)
Calcium: 9 mg/dL (ref 8.9–10.3)
Chloride: 97 mmol/L — ABNORMAL LOW (ref 98–111)
Creatinine, Ser: 1.19 mg/dL (ref 0.61–1.24)
GFR, Estimated: >60 mL/min (ref >60)
Glucose, Bld: 116 mg/dL — ABNORMAL HIGH (ref 70–99)
Potassium: 3.5 mmol/L (ref 3.5–5.1)
Sodium: 135 mmol/L (ref 135–145)
Total Bilirubin: 0.4 mg/dL (ref 0.3–1.2)
Total Protein: 7.7 g/dL (ref 6.5–8.1)

## 2022-03-07 LAB — ETHANOL: Alcohol, Ethyl (B): <10 mg/dL (ref <10)

## 2022-03-07 LAB — PROTIME-INR
INR: 0.9 (ref 0.8–1.2)
Prothrombin Time: 11.8 seconds (ref 11.4–15.2)

## 2022-03-07 LAB — CBG MONITORING, ED: Glucose-Capillary: 120 mg/dL — ABNORMAL HIGH (ref 70–99)

## 2022-03-07 MED ORDER — ACETAMINOPHEN 325 MG PO TABS
650.0000 mg | ORAL_TABLET | Freq: Once | ORAL | Status: AC
  Administered 2022-03-08: 650 mg via ORAL
  Filled 2022-03-07: qty 2

## 2022-03-07 MED ORDER — SODIUM CHLORIDE 0.9% FLUSH: 3.0000 mL | Freq: Once | INTRAVENOUS | Status: DC

## 2022-03-07 NOTE — ED Triage Notes (Signed)
Pt via pov from home with gait issues, headache, intermittent confusion since Monday morning. Pt reports that he awakened on Monday morning and was leaning to the right as he was walking and even hit the wall while walking. He states he was unable to straighten out his walk. [facility] and was [facility]. Pt alert & oriented, ambulating without difficulty or leaning to triage.

## 2022-03-07 NOTE — ED Provider Notes (Signed)
[facility] EMERGENCY DEPT Provider Note   CSN: [id number] Arrival date & time: 03/07/22  1754     History {Add pertinent medical, surgical, social history, OB history to HPI:1} Chief Complaint  Patient presents with   Gait Problem   Fatigue    Charles Underwood is a 76 y.o. male.  Patient is a 76 yo male with pmh of DM, hyperlipidemia, htn, asthma, ptsd with anxiety and depression, CAD, aortic aneurysm, and hx of thyroidectomy presenting for gait abnormalities. Pt admits to hx of generalized weakness and fatigue for 2-3 days. States he awoke yesterday morning, Monday 03/06/22, with difficulty walking and a headache. Wife states "He just kept leaning and walking off to the right side. He even bumped into the wall a few times". They deny any prior hx of strokes. Denies hx of recent trauma of falls. Denies difficulty speaking, slurred speech, facial assemetry, sensation deficits, or motor deficits. Pt denies blood thinner use. Takes a daily baby asa.  ROS positive for chills and and urinary frequency.   The history is provided by the patient. No language interpreter was used.       Home Medications Prior to Admission medications   Medication Sig Start Date End Date Taking? Authorizing Provider  aspirin 81 MG chewable tablet CHEW ONE TABLET BY MOUTH DAILY FOR HEART 05/03/20   [provider]  azelastine (ASTELIN) 0.1 % nasal spray USE 2 SPRAYS IN INTO NOSE EVERY DAY 11/25/20   [provider]  Biotin 2500 MCG CAPS Take 1 tablet by mouth daily.    [provider]  carboxymethylcellulose (REFRESH PLUS) 0.5 % SOLN INSTILL 1 DROP IN BOTH EYES TWICE A DAY 06/21/20   [provider]  chlorthalidone (HYGROTON) 25 MG tablet Take 12.5 mg by mouth daily.     [provider]  Cholecalciferol 25 MCG (1000 UT) tablet Take 1 tablet by mouth daily. 06/21/20   [provider]  diclofenac Sodium (VOLTAREN) 1 % GEL APPLY 2 GRAMS TO AFFECTED  AREA TWICE A DAY AND AS NEEDED TO PAINFUL JOINT (DO NOT TAKE IBUPROFEN OR ALEVE WHILE USING GEL) 04/07/20   [provider]  empagliflozin (JARDIANCE) 25 MG TABS tablet Take 12.5 mg by mouth daily.    [provider]  escitalopram (LEXAPRO) 10 MG tablet Take 10 mg by mouth daily.    [provider]  ferrous sulfate 325 (65 FE) MG tablet Take 325 mg by mouth daily with breakfast.    [provider]  fluticasone (FLONASE) 50 MCG/ACT nasal spray INSTILL 2 SPRAYS IN EACH NOSTRIL DAILY 11/23/20   [provider]  KRILL OIL PO Take 350 mg by mouth daily. Taking Mega Red    [provider]  levothyroxine (SYNTHROID) 125 MCG tablet Take 125 mcg by mouth daily before breakfast. 10/04/21   [provider]  losartan (COZAAR) 100 MG tablet TAKE ONE TABLET BY MOUTH DAILY FOR BLOOD PRESSURE 05/03/20   [provider]  metFORMIN (GLUCOPHAGE) 850 MG tablet Take 850 mg by mouth 2 (two) times daily with a meal.    [provider]  metoprolol tartrate (LOPRESSOR) 100 MG tablet Take 1 tablet by mouth 2 hours prior to scan 07/29/21   Sherren Mocha, MD  Multiple Vitamins-Minerals (CENTRUM SILVER 50+MEN) TABS Take by mouth. Per patient taking every other day    [provider]  Multiple Vitamins-Minerals (ICAPS AREDS 2) CAPS Take 1 capsule by mouth 2 (two) times daily.     [provider]  pantoprazole (PROTONIX) 40 MG tablet TAKE ONE TABLET BY MOUTH TWICE A DAY BEFORE MEALS (TAKE ON AN EMPTY STOMACH 30 MINUTES PRIOR TO A MEAL) 04/09/20   [provider]  potassium chloride (K-DUR,KLOR-CON) 10 MEQ tablet Take 10 mEq by mouth daily. Taking 5 MEQ    [provider]  rosuvastatin (CRESTOR) 20 MG tablet Take 10 mg by mouth daily.     [provider]  silver sulfADIAZINE (SILVADENE) 1 % cream Apply 1 application topically daily. 04/26/20   Susy Frizzle, MD  traZODone (DESYREL) 100 MG tablet Take 150 mg by  mouth at bedtime as needed for sleep.    [provider]  vitamin B-12 (CYANOCOBALAMIN) 1000 MCG tablet Take 1,000 mcg by mouth daily.    [provider]  vitamin C (ASCORBIC ACID) 500 MG tablet Take 500 mg by mouth daily. Patient taking 250 mg    [provider]      Allergies    Patient has no known allergies.    Review of Systems   Review of Systems  Constitutional:  Negative for chills and fever.  HENT:  Negative for ear pain and sore throat.   Eyes:  Negative for pain and visual disturbance.  Respiratory:  Negative for cough and shortness of breath.   Cardiovascular:  Negative for chest pain and palpitations.  Gastrointestinal:  Negative for abdominal pain and vomiting.  Genitourinary:  Negative for dysuria and hematuria.  Musculoskeletal:  Positive for gait problem. Negative for arthralgias and back pain.  Skin:  Negative for color change and rash.  Neurological:  Negative for seizures and syncope.  All other systems reviewed and are negative.   Physical Exam Updated Vital Signs BP (!) 175/96 (BP Location: Right Arm)   Pulse 73   Temp 98 F (36.7 C) (Oral)   Resp (!) 24   Ht '6\' 2"'$  (1.88 m)   Wt 104.3 kg   SpO2 99%   BMI 29.53 kg/m  Physical Exam Vitals and nursing note reviewed.  Constitutional:      General: He is not in acute distress.    Appearance: He is well-developed.  HENT:     Head: Normocephalic and atraumatic.  Eyes:     General: Lids are normal. Vision grossly intact.     Conjunctiva/sclera: Conjunctivae normal.     Pupils: Pupils are equal, round, and reactive to light.     Visual Fields: Right eye visual fields normal and left eye visual fields normal.  Cardiovascular:     Rate and Rhythm: Normal rate and regular rhythm.     Heart sounds: No murmur heard. Pulmonary:     Effort: Pulmonary effort is normal. No respiratory distress.     Breath sounds: Normal breath sounds.  Abdominal:     Palpations: Abdomen is soft.      Tenderness: There is no abdominal tenderness.  Musculoskeletal:        General: No swelling.     Cervical back: Neck supple.  Skin:    General: Skin is warm and dry.     Capillary Refill: Capillary refill takes less than 2 seconds.  Neurological:     Mental Status: He is alert and oriented to person, place, and time.     GCS: GCS eye subscore is 4. GCS verbal subscore is 5. GCS motor subscore is 6.     Cranial Nerves: Cranial nerves 2-12 are intact.     Sensory: Sensation is intact.  Motor: Motor function is intact.     Coordination: Coordination is intact.     Gait: Gait abnormal.  Psychiatric:        Mood and Affect: Mood normal.     ED Results / Procedures / Treatments   Labs (all labs ordered are listed, but only abnormal results are displayed) Labs Reviewed  CBC - Abnormal; Notable for the following components:      Result Value   WBC 3.1 (*)    Platelets 125 (*)    All other components within normal limits  COMPREHENSIVE METABOLIC PANEL - Abnormal; Notable for the following components:   Chloride 97 (*)    Glucose, Bld 116 (*)    All other components within normal limits  CBG MONITORING, ED - Abnormal; Notable for the following components:   Glucose-Capillary 120 (*)    All other components within normal limits  RESP PANEL BY RT-PCR (RSV, FLU A&B, COVID)  [id number]  PROTIME-INR  APTT  DIFFERENTIAL  ETHANOL  TSH  URINALYSIS, ROUTINE W REFLEX MICROSCOPIC  CBG MONITORING, ED  TROPONIN I (HIGH SENSITIVITY)    EKG EKG Interpretation  Date/Time:  Tuesday March 07 2022 18:37:06 EST Ventricular Rate:  75 PR Interval:  216 QRS Duration: 92 QT Interval:  394 QTC Calculation: 439 R Axis:   -69 Text Interpretation: Sinus rhythm with 1st degree A-V block Left anterior fascicular block Minimal voltage criteria for LVH, may be normal variant ( Cornell product ) Septal infarct , age undetermined Abnormal ECG When compared with ECG of 12-Feb-2018 10:02, PREVIOUS ECG  IS PRESENT Confirmed by Campbell Stall ([id number]) on 19/50/9326 11:11:45 PM  Radiology CT HEAD WO CONTRAST  Result Date: 03/07/2022 CLINICAL DATA:  Headache, confusion EXAM: CT HEAD WITHOUT CONTRAST TECHNIQUE: Contiguous axial images were obtained from the base of the skull through the vertex without intravenous contrast. RADIATION DOSE REDUCTION: This exam was performed according to the departmental dose-optimization program which includes automated exposure control, adjustment of the mA and/or kV according to patient size and/or use of iterative reconstruction technique. COMPARISON:  02/12/2018 FINDINGS: Brain: No evidence of acute infarction, hemorrhage, hydrocephalus, extra-axial collection or mass lesion/mass effect. Mild subcortical white matter and periventricular small vessel ischemic changes. Vascular: Intracranial atherosclerosis. Skull: Normal. Negative for fracture or focal lesion. Sinuses/Orbits: The visualized paranasal sinuses are essentially clear. The mastoid air cells are unopacified. Other: None. IMPRESSION: No evidence of acute intracranial abnormality. Mild small vessel ischemic changes. Electronically Signed   By: Julian Hy M.D.   On: 03/07/2022 19:04    Procedures Procedures  {Document cardiac monitor, telemetry assessment procedure when appropriate:1}  Medications Ordered in ED Medications  sodium chloride flush (NS) 0.9 % injection 3 mL (has no administration in time range)  acetaminophen (TYLENOL) tablet 650 mg (has no administration in time range)    ED Course/ Medical Decision Making/ A&P                           Medical Decision Making Amount and/or Complexity of Data Reviewed Labs: ordered. Radiology: ordered.  Risk OTC drugs.   11:39 PM 76 yo male with pmh of DM, hyperlipidemia, htn, asthma, ptsd with anxiety and depression, CAD, aortic aneurysm, and hx of thyroidectomy presenting for gait abnormalities. Pt is Aox3, no acute distress, afebrile,  hypertensive, with otherwise stable vitals. Physical exam positive for abnormal gait only.   CT head demonstrates small ischemic changes only. Concerns for posterior stroke. MRI unavailable at  [facility] tonight. Will plan ED to ED transfer.   Labs ordered and pending to r/o other etiologies including thyroid function due to hx of weakness and thyroidectomy, UA due to urinary symptoms, and cardiac labs due to age and risk factors. Labs pending but should not delay transfer for MRI.   {Document critical care time when appropriate:1} {Document review of labs and clinical decision tools ie heart score, Chads2Vasc2 etc:1}  {Document your independent review of radiology images, and any outside records:1} {Document your discussion with family members, caretakers, and with consultants:1} {Document social determinants of health affecting pt's care:1} {Document your decision making why or why not admission, treatments were needed:1} Final Clinical Impression(s) / ED Diagnoses Final diagnoses:  1st degree AV block  Gait abnormality    Rx / DC Orders ED Discharge Orders     None

## 2022-03-08 DIAGNOSIS — I440 Atrioventricular block, first degree: Secondary | ICD-10-CM | POA: Diagnosis not present

## 2022-03-08 DIAGNOSIS — Z7982 Long term (current) use of aspirin: Secondary | ICD-10-CM | POA: Diagnosis not present

## 2022-03-08 DIAGNOSIS — Z7984 Long term (current) use of oral hypoglycemic drugs: Secondary | ICD-10-CM | POA: Diagnosis not present

## 2022-03-08 DIAGNOSIS — J45909 Unspecified asthma, uncomplicated: Secondary | ICD-10-CM | POA: Diagnosis not present

## 2022-03-08 DIAGNOSIS — E119 Type 2 diabetes mellitus without complications: Secondary | ICD-10-CM | POA: Diagnosis not present

## 2022-03-08 DIAGNOSIS — R269 Unspecified abnormalities of gait and mobility: Secondary | ICD-10-CM | POA: Diagnosis present

## 2022-03-08 DIAGNOSIS — J101 Influenza due to other identified influenza virus with other respiratory manifestations: Secondary | ICD-10-CM | POA: Diagnosis not present

## 2022-03-08 DIAGNOSIS — Z79899 Other long term (current) drug therapy: Secondary | ICD-10-CM | POA: Diagnosis not present

## 2022-03-08 DIAGNOSIS — I2510 Atherosclerotic heart disease of native coronary artery without angina pectoris: Secondary | ICD-10-CM | POA: Diagnosis not present

## 2022-03-08 DIAGNOSIS — I10 Essential (primary) hypertension: Secondary | ICD-10-CM | POA: Diagnosis not present

## 2022-03-08 DIAGNOSIS — R2990 Unspecified symptoms and signs involving the nervous system: Secondary | ICD-10-CM | POA: Diagnosis not present

## 2022-03-08 DIAGNOSIS — Z1152 Encounter for screening for COVID-19: Secondary | ICD-10-CM | POA: Diagnosis not present

## 2022-03-08 LAB — URINALYSIS, ROUTINE W REFLEX MICROSCOPIC
Bilirubin Urine: NEGATIVE
Glucose, UA: >1000 mg/dL — AB
Hgb urine dipstick: NEGATIVE
Leukocytes,Ua: NEGATIVE
Nitrite: NEGATIVE
Protein, ur: NEGATIVE mg/dL
Specific Gravity, Urine: 1.028 (ref 1.005–1.030)
pH: 5 (ref 5.0–8.0)

## 2022-03-08 LAB — RESP PANEL BY RT-PCR (RSV, FLU A&B, COVID)  RVPGX2
Influenza A by PCR: POSITIVE — AB
Influenza B by PCR: NEGATIVE
Resp Syncytial Virus by PCR: NEGATIVE
SARS Coronavirus 2 by RT PCR: NEGATIVE

## 2022-03-08 LAB — TSH: TSH: 0.482 u[IU]/mL (ref 0.350–4.500)

## 2022-03-08 LAB — TROPONIN I (HIGH SENSITIVITY): Troponin I (High Sensitivity): 5 ng/L (ref <18)

## 2022-03-08 NOTE — ED Notes (Signed)
BIB [facility] from [facility] for MRI. Pt went to [facility] for headache, abnormalities with gait, and intermittent confusion x 2 days. Woke up Monday morning leaning to the rt and couldn't get walk to go back to normal. Normally followed by the [facility] and was advised by the [facility] to go to the ER for further eval. ER head ct showed mild ischemic changes. Transferred here for MRI. Tylenol '650mg'$  around 0006 for headache. PIV 20ga Lt AC sl.

## 2022-03-08 NOTE — Discharge Instructions (Signed)

## 2022-03-08 NOTE — ED Provider Notes (Signed)
I assumed care of patient after he was transferred from the [facility] hospital for an MRI. Patient has had episodes of generalized weakness and fatigue.  He has also reportedly had difficulty walking. He was found to have influenza.  Patient reports he is ready to be discharged home.  He has been waiting for up to 12 hours and feel better going home and sleeping.  He reports he can get a close follow-up with [facility] I had a lengthy discussion with patient and his spouse about this.  He understands that we cannot fully rule out acute stroke without an MRI.  He understands this risk.  Patient is awake and alert and able to make his own decisions.  He will be discharged   Ripley Fraise, MD 03/08/22 [id number]

## 2022-03-08 NOTE — ED Notes (Signed)
ED provider at bedside.

## 2022-08-18 ENCOUNTER — Encounter (HOSPITAL_COMMUNITY): Payer: Self-pay | Admitting: Family Medicine

## 2022-08-18 ENCOUNTER — Ambulatory Visit (HOSPITAL_COMMUNITY)
Admission: RE | Admit: 2022-08-18 | Discharge: 2022-08-18 | Disposition: A | Discharge status: Home / Self Care | Payer: Medicare Other | Source: Ambulatory Visit | Attending: Cardiovascular Disease | Admitting: Cardiovascular Disease

## 2022-08-18 DIAGNOSIS — E1140 Type 2 diabetes mellitus with diabetic neuropathy, unspecified: Secondary | ICD-10-CM | POA: Insufficient documentation

## 2022-08-18 DIAGNOSIS — I7121 Aneurysm of the ascending aorta, without rupture: Secondary | ICD-10-CM | POA: Diagnosis present

## 2022-08-18 MED ORDER — IOHEXOL 350 MG/ML SOLN
75.0000 mL | Freq: Once | INTRAVENOUS | Status: AC | PRN
  Administered 2022-08-18: 75 mL via INTRAVENOUS

## 2022-08-21 LAB — POCT I-STAT CREATININE: Creatinine, Ser: 1.2 mg/dL (ref 0.61–1.24)
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
## 2022-08-23 ENCOUNTER — Encounter: Payer: Self-pay | Admitting: Nurse Practitioner

## 2022-08-23 ENCOUNTER — Ambulatory Visit: Payer: Medicare Other | Attending: Nurse Practitioner | Admitting: Nurse Practitioner

## 2022-08-23 VITALS — BP 128/74 | HR 77 | Ht 74.0 in | Wt 228.0 lb

## 2022-08-23 DIAGNOSIS — I2510 Atherosclerotic heart disease of native coronary artery without angina pectoris: Secondary | ICD-10-CM

## 2022-08-23 DIAGNOSIS — I7121 Aneurysm of the ascending aorta, without rupture: Secondary | ICD-10-CM | POA: Diagnosis not present

## 2022-08-23 DIAGNOSIS — E785 Hyperlipidemia, unspecified: Secondary | ICD-10-CM | POA: Diagnosis not present

## 2022-08-23 DIAGNOSIS — I10 Essential (primary) hypertension: Secondary | ICD-10-CM | POA: Diagnosis not present

## 2022-08-23 DIAGNOSIS — K861 Other chronic pancreatitis: Secondary | ICD-10-CM

## 2022-08-23 NOTE — Patient Instructions (Signed)
Medication Instructions:  Your physician recommends that you continue on your current medications as directed. Please refer to the Current Medication list given to you today. *If you need a refill on your cardiac medications before your next appointment, please call your pharmacy*   Lab Work: None ordered If you have labs (blood work) drawn today and your tests are completely normal, you will receive your results only by: MyChart Message (if you have MyChart) OR A paper copy in the mail If you have any lab test that is abnormal or we need to change your treatment, we will call you to review the results.   Testing/Procedures: None ordered   Follow-Up: At [facility], you and your health needs are our priority.  As part of our continuing mission to provide you with exceptional heart care, we have created designated Provider Care Teams.  These Care Teams include your primary Cardiologist (physician) and Advanced Practice Providers (APPs -  Physician Assistants and Nurse Practitioners) who all work together to provide you with the care you need, when you need it.  We recommend signing up for the patient portal called "MyChart".  Sign up information is provided on this After Visit Summary.  MyChart is used to connect with patients for Virtual Visits (Telemedicine).  Patients are able to view lab/test results, encounter notes, upcoming appointments, etc.  Non-urgent messages can be sent to your provider as well.   To learn more about what you can do with MyChart, go to [email or web address].    Your next appointment:   12 month(s)  Provider:   Tonny Bollman, MD  or Robin Searing, NP   You have been referred to [facility]  Other Instructions
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
## 2022-09-05 ENCOUNTER — Ambulatory Visit (INDEPENDENT_AMBULATORY_CARE_PROVIDER_SITE_OTHER): Payer: Medicare Other | Admitting: Physician Assistant

## 2022-09-05 ENCOUNTER — Other Ambulatory Visit (INDEPENDENT_AMBULATORY_CARE_PROVIDER_SITE_OTHER): Payer: Medicare Other

## 2022-09-05 ENCOUNTER — Encounter: Payer: Self-pay | Admitting: Physician Assistant

## 2022-09-05 VITALS — BP 122/82 | HR 61 | Ht 74.0 in | Wt 227.0 lb

## 2022-09-05 DIAGNOSIS — R195 Other fecal abnormalities: Secondary | ICD-10-CM

## 2022-09-05 DIAGNOSIS — K861 Other chronic pancreatitis: Secondary | ICD-10-CM

## 2022-09-05 DIAGNOSIS — K219 Gastro-esophageal reflux disease without esophagitis: Secondary | ICD-10-CM | POA: Diagnosis not present

## 2022-09-05 DIAGNOSIS — E1140 Type 2 diabetes mellitus with diabetic neuropathy, unspecified: Secondary | ICD-10-CM | POA: Diagnosis not present

## 2022-09-05 DIAGNOSIS — Z86010 Personal history of colonic polyps: Secondary | ICD-10-CM

## 2022-09-05 DIAGNOSIS — Z794 Long term (current) use of insulin: Secondary | ICD-10-CM

## 2022-09-05 LAB — HEPATIC FUNCTION PANEL
ALT: 19 U/L (ref 0–53)
AST: 14 U/L (ref 0–37)
Albumin: 4.4 g/dL (ref 3.5–5.2)
Alkaline Phosphatase: 72 U/L (ref 39–117)
Bilirubin, Direct: 0.1 mg/dL (ref 0.0–0.3)
Total Bilirubin: 0.7 mg/dL (ref 0.2–1.2)
Total Protein: 7.8 g/dL (ref 6.0–8.3)

## 2022-09-05 LAB — CBC WITH DIFFERENTIAL/PLATELET
Basophils Absolute: 0 10*3/uL (ref 0.0–0.1)
Basophils Relative: 0.9 % (ref 0.0–3.0)
Eosinophils Absolute: 0.3 10*3/uL (ref 0.0–0.7)
Eosinophils Relative: 5.8 % — ABNORMAL HIGH (ref 0.0–5.0)
HCT: 45.5 % (ref 39.0–52.0)
Hemoglobin: 14.9 g/dL (ref 13.0–17.0)
Lymphocytes Relative: 27.9 % (ref 12.0–46.0)
Lymphs Abs: 1.4 10*3/uL (ref 0.7–4.0)
MCHC: 32.6 g/dL (ref 30.0–36.0)
MCV: 86.2 fl (ref 78.0–100.0)
Monocytes Absolute: 0.4 10*3/uL (ref 0.1–1.0)
Monocytes Relative: 8 % (ref 3.0–12.0)
Neutro Abs: 2.9 10*3/uL (ref 1.4–7.7)
Neutrophils Relative %: 57.4 % (ref 43.0–77.0)
Platelets: 146 10*3/uL — ABNORMAL LOW (ref 150.0–400.0)
RBC: 5.28 Mil/uL (ref 4.22–5.81)
RDW: 15.6 % — ABNORMAL HIGH (ref 11.5–15.5)
WBC: 5 10*3/uL (ref 4.0–10.5)

## 2022-09-05 LAB — TRIGLYCERIDES: Triglycerides: 201 mg/dL — ABNORMAL HIGH (ref 0.0–149.0)

## 2022-09-05 NOTE — Patient Instructions (Addendum)
We have scheduled you for 3 month follow with Quentin Mulling on 12/14/2022 AT 11:00AM   You have been scheduled for an MRCP at [facility]  on 09/08/2022. Your appointment time is 7:00am. Please arrive to admitting (at main entrance of the hospital) 30 minutes prior to your appointment time for registration purposes. Please make certain not to have anything to eat or drink 6 hours prior to your test. In addition, if you have any metal in your body, have a pacemaker or defibrillator, please be sure to let your ordering physician know. This test typically takes 45 minutes to 1 hour to complete. Should you need to reschedule, please call [phone] to do so.   Your provider has requested that you go to the basement level for lab work before leaving today. Press "B" on the elevator. The lab is located at the first door on the left as you exit the elevator.  Chronic Pancreatitis  Chronic pancreatitis is permanent inflammation and scarring of the pancreas that leads to pancreatic dysfunction. The pancreas is a gland that is found behind the stomach. The pancreas makes proteins (enzymes) that help to digest food. It also releases hormones called glucagon and insulin. These help regulate blood sugar (glucose). Damage to the pancreas may affect digestion, may cause pain in the upper abdomen and back, and may cause diabetes. Inflammation can also irritate other organs in the abdomen near the pancreas. At first, pancreatitis may be sudden (acute). When you have repeated or long-lasting episodes of acute pancreatitis, damage to the pancreas can be permanent and lead to chronic pancreatitis. Sometimes, though, there is no history of acute pancreatitis. What are the causes? The most common cause of this condition is heavy alcohol use. Other causes include: Hypertriglyceridemia. This is increased, or elevated, levels of triglycerides in the blood. Gallstones or other conditions that block the tube that drains the  pancreas (pancreatic duct). Health conditions such as pancreatic cancer or a problem where the body's defense system (immune system) attacks the pancreas (autoimmune pancreatitis). Hypercalcemia. This is elevated calcium levels in the blood. This condition may be caused by the parathyroid gland being too active (hyperparathyroidism). Having an injured or infected pancreas. Being exposed to certain medicines or certain chemicals. In children, chronic pancreatitis is most often caused by inherited conditions. These come from genes that are passed from parent to child. The most common of these conditions is cystic fibrosis. In some cases, the cause of chronic pancreatitis may not be known. What increases the risk? This condition is more likely to develop in people who: Are male. Are 46-48 years old. Have a family history of pancreatitis. Smoke tobacco. Drink a lot of alcohol over a long period of time. What are the signs or symptoms? Symptoms of this condition may include: Pain in the abdomen or upper back. Pain may be severe and often gets worse after you eat. Nausea and vomiting. Fever. Weight loss. A change in the color and firmness (consistency) of stool (feces), such as stools that are oily, fatty, or clay-colored. How is this diagnosed? This condition is diagnosed based on your symptoms, your medical history, and a physical exam. You may have tests, such as: Blood tests. Stool samples. Biopsy of the pancreas. This is the removal of a sample of pancreas tissue to be tested in a lab. Imaging tests, such as CT scans, MRIs, or an ultrasound of the abdomen. How is this treated? Chronic pancreatitis results in permanent damage that leads to pancreatic dysfunction and  long-term (chronic) pain. To manage chronic pancreatitis, you will need to: Stop using alcohol or tobacco. Manage pain. Methods of pain control may include: Medicines such as those used for pain or depression  (antidepressants). Surgery to remove a blockage or buildup of fluid causing pain. A procedure to block the nerves that sense pain in the pancreas (celiac plexus nerve block). Improve digestion. You may be given: Medicines to replace your pancreatic enzymes. Vitamin supplements. A specific diet to follow. You may work with a dietitian to make an eating plan. Monitor for the development of diabetes. You may need to: Get screened for diabetes regularly. Check your blood glucose levels at home at regular times. Sometimes, acute flares of pain may require hospital treatment. Follow these instructions at home: Eating and drinking     Do not drink alcohol. If you need help quitting, ask your health care provider. Follow a diet as told by your health care provider or dietitian, if this applies. This may include: Limiting how much fat you eat. Eating smaller meals more often. Avoiding caffeine. Drink enough fluid to keep your urine pale yellow. General instructions Take over-the-counter and prescription medicines only as told by your health care provider. These include vitamin supplements. Ask your health care provider if the medicine prescribed to you: Requires you to avoid driving or using machinery. Can cause constipation. You may need to take these actions to prevent or treat constipation: Take over-the-counter or prescription medicines. Eat foods that are high in fiber, such as beans, whole grains, and fresh fruits and vegetables. Limit foods that are high in fat and processed sugars, such as fried or sweet foods. Do not use any products that contain nicotine or tobacco. These products include cigarettes, chewing tobacco, and vaping devices, such as e-cigarettes. If you need help quitting, ask your health care provider. If directed, check your blood sugar at home as told. Keep all follow-up visits. This is important. Contact a health care provider if: You have pain that does not get  better with medicine. You have a fever. You have sudden weight loss. Get help right away if: Your pain suddenly gets worse. You have sudden swelling in your abdomen. You start to vomit often. You have diarrhea that does not go away. You vomit blood or have blood in your stool. You become confused or you have trouble thinking clearly. These symptoms may be an emergency. Get help right away. Call 911. Do not wait to see if the symptoms will go away. Do not drive yourself to the hospital. Summary Chronic pancreatitis is permanent inflammation and scarring of the pancreas that leads to pancreatic dysfunction. Damage to the pancreas may affect digestion, may cause pain in the upper abdomen and back, and may cause diabetes. Inflammation can also irritate other organs in the abdomen near the pancreas. Common causes of this condition are heavy alcohol use, gallstones, increased (elevated) levels of triglycerides, and certain medicines. To manage this condition: control pain, replace enzymes, and do not drink alcohol. This information is not intended to replace advice given to you by your health care provider. Make sure you discuss any questions you have with your health care provider. Document Revised: 01/25/2021 Document Reviewed: 01/25/2021 Elsevier Patient Education  2024 [employer].

## 2022-09-06 LAB — CANCER ANTIGEN 19-9: CA 19-9: 30 U/mL (ref <34)

## 2022-09-07 ENCOUNTER — Ambulatory Visit: Payer: Medicare Other

## 2022-09-07 DIAGNOSIS — R195 Other fecal abnormalities: Secondary | ICD-10-CM

## 2022-09-07 DIAGNOSIS — K861 Other chronic pancreatitis: Secondary | ICD-10-CM

## 2022-09-08 ENCOUNTER — Ambulatory Visit (HOSPITAL_COMMUNITY)
Admission: RE | Admit: 2022-09-08 | Discharge: 2022-09-08 | Disposition: A | Discharge status: Home / Self Care | Payer: Medicare Other | Source: Ambulatory Visit | Attending: Physician Assistant | Admitting: Physician Assistant

## 2022-09-08 ENCOUNTER — Other Ambulatory Visit: Payer: Self-pay | Admitting: Physician Assistant

## 2022-09-08 ENCOUNTER — Encounter: Payer: Self-pay | Admitting: Urology

## 2022-09-08 ENCOUNTER — Ambulatory Visit (INDEPENDENT_AMBULATORY_CARE_PROVIDER_SITE_OTHER): Payer: No Typology Code available for payment source | Admitting: Urology

## 2022-09-08 VITALS — BP 133/76 | HR 79 | Ht 74.0 in | Wt 228.0 lb

## 2022-09-08 DIAGNOSIS — N3941 Urge incontinence: Secondary | ICD-10-CM

## 2022-09-08 DIAGNOSIS — R195 Other fecal abnormalities: Secondary | ICD-10-CM

## 2022-09-08 DIAGNOSIS — K861 Other chronic pancreatitis: Secondary | ICD-10-CM | POA: Insufficient documentation

## 2022-09-08 DIAGNOSIS — R350 Frequency of micturition: Secondary | ICD-10-CM

## 2022-09-08 LAB — URINALYSIS, COMPLETE
Bilirubin, UA: NEGATIVE
Ketones, UA: NEGATIVE
Leukocytes,UA: NEGATIVE
Nitrite, UA: NEGATIVE
Protein,UA: NEGATIVE
RBC, UA: NEGATIVE
Specific Gravity, UA: 1.01 (ref 1.005–1.030)
Urobilinogen, Ur: 0.2 mg/dL (ref 0.2–1.0)
pH, UA: 5 (ref 5.0–7.5)

## 2022-09-08 LAB — MICROSCOPIC EXAMINATION

## 2022-09-08 LAB — BLADDER SCAN AMB NON-IMAGING: Scan Result: 0

## 2022-09-08 MED ORDER — GADOBUTROL 1 MMOL/ML IV SOLN
10.0000 mL | Freq: Once | INTRAVENOUS | Status: AC | PRN
  Administered 2022-09-08: 10 mL via INTRAVENOUS

## 2022-09-08 MED ORDER — SILODOSIN 8 MG PO CAPS: 8.0000 mg | ORAL_CAPSULE | Freq: Every day | ORAL | 0 refills | Status: DC
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
## 2022-09-15 LAB — FECAL FAT, QUALITATIVE
Fat Qual Neutral, Stl: NORMAL
Fat Qual Total, Stl: NORMAL

## 2022-09-16 LAB — PANCREATIC ELASTASE, FECAL: Pancreatic Elastase-1, Stool: >500 mcg/g

## 2022-10-23 ENCOUNTER — Ambulatory Visit: Payer: Medicare Other | Admitting: Family Medicine

## 2022-11-01 ENCOUNTER — Encounter (HOSPITAL_COMMUNITY): Payer: Self-pay

## 2022-11-01 ENCOUNTER — Emergency Department (HOSPITAL_COMMUNITY): Payer: No Typology Code available for payment source

## 2022-11-01 ENCOUNTER — Emergency Department (HOSPITAL_COMMUNITY)
Admission: EM | Admit: 2022-11-01 | Discharge: 2022-11-01 | Disposition: A | Discharge status: Home / Self Care | Payer: No Typology Code available for payment source

## 2022-11-01 ENCOUNTER — Other Ambulatory Visit: Payer: Self-pay

## 2022-11-01 DIAGNOSIS — R079 Chest pain, unspecified: Secondary | ICD-10-CM | POA: Diagnosis present

## 2022-11-01 DIAGNOSIS — E119 Type 2 diabetes mellitus without complications: Secondary | ICD-10-CM | POA: Insufficient documentation

## 2022-11-01 DIAGNOSIS — U071 COVID-19: Secondary | ICD-10-CM | POA: Diagnosis not present

## 2022-11-01 DIAGNOSIS — I2510 Atherosclerotic heart disease of native coronary artery without angina pectoris: Secondary | ICD-10-CM | POA: Insufficient documentation

## 2022-11-01 DIAGNOSIS — Z7982 Long term (current) use of aspirin: Secondary | ICD-10-CM | POA: Diagnosis not present

## 2022-11-01 DIAGNOSIS — M542 Cervicalgia: Secondary | ICD-10-CM | POA: Insufficient documentation

## 2022-11-01 DIAGNOSIS — I10 Essential (primary) hypertension: Secondary | ICD-10-CM | POA: Diagnosis not present

## 2022-11-01 DIAGNOSIS — J45909 Unspecified asthma, uncomplicated: Secondary | ICD-10-CM | POA: Diagnosis not present

## 2022-11-01 DIAGNOSIS — E860 Dehydration: Secondary | ICD-10-CM | POA: Diagnosis not present

## 2022-11-01 DIAGNOSIS — Z7984 Long term (current) use of oral hypoglycemic drugs: Secondary | ICD-10-CM | POA: Insufficient documentation

## 2022-11-01 DIAGNOSIS — Z79899 Other long term (current) drug therapy: Secondary | ICD-10-CM | POA: Insufficient documentation

## 2022-11-01 DIAGNOSIS — R55 Syncope and collapse: Secondary | ICD-10-CM | POA: Diagnosis not present

## 2022-11-01 LAB — CBC
HCT: 50 % (ref 39.0–52.0)
Hemoglobin: 16.4 g/dL (ref 13.0–17.0)
MCH: 27.7 pg (ref 26.0–34.0)
MCHC: 32.8 g/dL (ref 30.0–36.0)
MCV: 84.3 fL (ref 80.0–100.0)
Platelets: 153 10*3/uL (ref 150–400)
RBC: 5.93 MIL/uL — ABNORMAL HIGH (ref 4.22–5.81)
RDW: 15.7 % — ABNORMAL HIGH (ref 11.5–15.5)
WBC: 3.8 10*3/uL — ABNORMAL LOW (ref 4.0–10.5)
nRBC: 0 % (ref 0.0–0.2)

## 2022-11-01 LAB — BASIC METABOLIC PANEL
Anion gap: 15 (ref 5–15)
BUN: 23 mg/dL (ref 8–23)
CO2: 24 mmol/L (ref 22–32)
Calcium: 9.1 mg/dL (ref 8.9–10.3)
Chloride: 97 mmol/L — ABNORMAL LOW (ref 98–111)
Creatinine, Ser: 1.39 mg/dL — ABNORMAL HIGH (ref 0.61–1.24)
GFR, Estimated: 52 mL/min — ABNORMAL LOW (ref >60)
Glucose, Bld: 198 mg/dL — ABNORMAL HIGH (ref 70–99)
Potassium: 3.4 mmol/L — ABNORMAL LOW (ref 3.5–5.1)
Sodium: 136 mmol/L (ref 135–145)

## 2022-11-01 LAB — TROPONIN I (HIGH SENSITIVITY)
Troponin I (High Sensitivity): 10 ng/L (ref <18)
Troponin I (High Sensitivity): 9 ng/L (ref <18)

## 2022-11-01 MED ORDER — SODIUM CHLORIDE 0.9 % IV BOLUS
500.0000 mL | Freq: Once | INTRAVENOUS | Status: AC
  Administered 2022-11-01: 500 mL via INTRAVENOUS

## 2022-11-01 MED ORDER — HYDROCODONE-ACETAMINOPHEN 5-325 MG PO TABS
1.0000 | ORAL_TABLET | Freq: Once | ORAL | Status: AC
  Administered 2022-11-01: 1 via ORAL
  Filled 2022-11-01: qty 1

## 2022-11-01 MED ORDER — ACETAMINOPHEN 325 MG PO TABS
650.0000 mg | ORAL_TABLET | Freq: Once | ORAL | Status: AC
  Administered 2022-11-01: 650 mg via ORAL
  Filled 2022-11-01: qty 2

## 2022-11-01 NOTE — ED Notes (Signed)
Pt transported to CT ?

## 2022-11-01 NOTE — ED Provider Notes (Signed)
[place] [facility] DEPARTMENT AT [facility] Provider Note   CSN: [id number] Arrival date & time: 11/01/22  1151     History  Chief Complaint  Patient presents with   Covid Positive   Chest Pain   Loss of Consciousness   Neck Pain    Charles Underwood is a 77 y.o. male with medical history of aortic aneurysm, asthma, atypical chest pain, basal cell carcinoma, CAD, cervical spondylosis, diabetes, diverticulosis, hemorrhoids, hypertension, IBS, DermaSarra pia.  Patient presents to ED for evaluation of COVID-positive status, chest pain, fall.  The patient reports that this morning around 5:30 AM he woke up to use the bathroom.  Patient states that he typically wakes up throughout the night to use the bathroom.  Patient reports that he got up, went to the bathroom and urinated and then "blacked out".  Patient states that upon ending urination, he does not remember anything that happened after this until he was down on the floor and his wife was over him.  The wife reports that she heard a very loud "bang" and went to found her husband slumped against the door.  They both believe that the patient fell.  The patient states he did hit the back of his head.  He denies preceding chest pain or shortness of breath prior to the fall.  He states that he was standing up to urinate.  Patient reports history of "blacking out" in the past.  Unsure etiology.  Patient denies tongue biting, urinary incontinence during this event.  Denies history of seizures.  Denies blood thinning medication.  Patient reports that he Can do better at this time however woke up a few hours later and still felt very ill, this time had left-sided chest pain that did not radiate.  He states the chest pain is been on and off throughout the course of the day and initiated when he fell this morning.  He states currently he has no chest pain.  He denies shortness of breath during any of this.  He denies one-sided weakness or  numbness, nausea, vomiting, diarrhea, abdominal pain.  He is endorsing headache, neck pain.  He is also COVID-positive.  States that he tested on Sunday and was positive for COVID.  He denies lightheadedness, dizziness.  He is endorsing generalized weakness to his lower extremities.   Chest Pain Associated symptoms: fever, headache and syncope   Associated symptoms: no numbness, no shortness of breath and no weakness   Loss of Consciousness Associated symptoms: chest pain, fever and headaches   Associated symptoms: no seizures, no shortness of breath and no weakness   Neck Pain Associated symptoms: chest pain, fever, headaches and syncope   Associated symptoms: no numbness and no weakness        Home Medications Prior to Admission medications   Medication Sig Start Date End Date Taking? Authorizing Provider  aspirin 81 MG chewable tablet CHEW ONE TABLET BY MOUTH DAILY FOR HEART 05/03/20   [provider]  azelastine (ASTELIN) 0.1 % nasal spray USE 2 SPRAYS IN INTO NOSE EVERY DAY 11/25/20   [provider]  carboxymethylcellulose (REFRESH PLUS) 0.5 % SOLN INSTILL 1 DROP IN BOTH EYES TWICE A DAY 06/21/20   [provider]  chlorthalidone (HYGROTON) 25 MG tablet Take 12.5 mg by mouth daily.     [provider]  Cholecalciferol 25 MCG (1000 UT) tablet Take 1 tablet by mouth daily. 06/21/20   [provider]  diclofenac Sodium (VOLTAREN) 1 %  GEL APPLY 2 GRAMS TO AFFECTED AREA TWICE A DAY AND AS NEEDED TO PAINFUL JOINT (DO NOT TAKE IBUPROFEN OR ALEVE WHILE USING GEL) 04/07/20   [provider]  empagliflozin (JARDIANCE) 25 MG TABS tablet Take 12.5 mg by mouth daily.    [provider]  escitalopram (LEXAPRO) 10 MG tablet Take 10 mg by mouth daily.    [provider]  fluticasone (FLONASE) 50 MCG/ACT nasal spray INSTILL 2 SPRAYS IN EACH NOSTRIL DAILY 11/23/20   [provider]  KRILL OIL PO Take 350 mg by mouth daily. Taking  Mega Red    [provider]  levothyroxine (SYNTHROID) 100 MCG tablet Take 100 mcg by mouth daily before breakfast. 04/10/22   [provider]  losartan (COZAAR) 100 MG tablet TAKE ONE TABLET BY MOUTH DAILY FOR BLOOD PRESSURE 05/03/20   [provider]  metFORMIN (GLUCOPHAGE) 1000 MG tablet Take 1,000 mg by mouth 2 (two) times daily with a meal.    [provider]  metoprolol tartrate (LOPRESSOR) 100 MG tablet Take 1 tablet by mouth 2 hours prior to scan 07/29/21   Tonny Bollman, MD  Multiple Vitamins-Minerals (ICAPS AREDS 2) CAPS Take 1 capsule by mouth 2 (two) times daily.     [provider]  pantoprazole (PROTONIX) 40 MG tablet TAKE ONE TABLET BY MOUTH TWICE A DAY BEFORE MEALS (TAKE ON AN EMPTY STOMACH 30 MINUTES PRIOR TO A MEAL) 04/09/20   [provider]  rosuvastatin (CRESTOR) 20 MG tablet Take 10 mg by mouth daily.     [provider]  silodosin (RAPAFLO) 8 MG CAPS capsule Take 1 capsule (8 mg total) by mouth daily with breakfast. 09/08/22   Stoioff, Verna Czech, MD  traZODone (DESYREL) 100 MG tablet Take 150 mg by mouth at bedtime as needed for sleep.    [provider]  vitamin B-12 (CYANOCOBALAMIN) 1000 MCG tablet Take 1,000 mcg by mouth daily.    [provider]  vitamin C (ASCORBIC ACID) 500 MG tablet Take 500 mg by mouth daily. Patient taking 250 mg    [provider]      Allergies    Patient has no known allergies.    Review of Systems   Review of Systems  Constitutional:  Positive for fever.  Respiratory:  Negative for shortness of breath.   Cardiovascular:  Positive for chest pain and syncope. Negative for leg swelling.  Musculoskeletal:  Positive for neck pain.  Neurological:  Positive for syncope and headaches. Negative for seizures, weakness and numbness.  All other systems reviewed and are negative.   Physical Exam Updated Vital Signs BP 121/86 (BP Location: Right Arm)   Pulse 66    Temp 98.4 F (36.9 C) (Oral)   Resp 12   Ht 6\' 2"  (1.88 m)   Wt 99.8 kg   SpO2 97%   BMI 28.25 kg/m  Physical Exam Vitals and nursing note reviewed.  Constitutional:      General: He is not in acute distress.    Appearance: Normal appearance. He is not ill-appearing, toxic-appearing or diaphoretic.  HENT:     Head: Normocephalic and atraumatic.     Nose: Nose normal.     Mouth/Throat:     Mouth: Mucous membranes are moist.     Pharynx: Oropharynx is clear.  Eyes:     Extraocular Movements: Extraocular movements intact.     Conjunctiva/sclera: Conjunctivae normal.     Pupils: Pupils are equal, round, and reactive to light.  Cardiovascular:     Rate and Rhythm: Normal rate and regular rhythm.  Pulmonary:     Effort: Pulmonary effort is normal.     Breath sounds: Normal breath sounds. No wheezing.  Abdominal:     General: Abdomen is flat. Bowel sounds are normal.     Palpations: Abdomen is soft.     Tenderness: There is no abdominal tenderness.  Musculoskeletal:     Cervical back: Normal range of motion and neck supple. No tenderness.     Comments: Centralized cervical spinal tenderness.  Patient in c-collar.  Skin:    General: Skin is warm and dry.     Capillary Refill: Capillary refill takes less than 2 seconds.  Neurological:     General: No focal deficit present.     Mental Status: He is alert and oriented to person, place, and time.     GCS: GCS eye subscore is 4. GCS verbal subscore is 5. GCS motor subscore is 6.     Cranial Nerves: Cranial nerves 2-12 are intact. No cranial nerve deficit.     Sensory: Sensation is intact. No sensory deficit.     Motor: Motor function is intact. No weakness.     Coordination: Coordination is intact. Heel to [facility] Test normal.     Comments: No focal neurodeficits on examination.     ED Results / Procedures / Treatments   Labs (all labs ordered are listed, but only abnormal results are displayed) Labs Reviewed  CBC - Abnormal;  Notable for the following components:      Result Value   WBC 3.8 (*)    RBC 5.93 (*)    RDW 15.7 (*)    All other components within normal limits  BASIC METABOLIC PANEL - Abnormal; Notable for the following components:   Potassium 3.4 (*)    Chloride 97 (*)    Glucose, Bld 198 (*)    Creatinine, Ser 1.39 (*)    GFR, Estimated 52 (*)    All other components within normal limits  TROPONIN I (HIGH SENSITIVITY)  TROPONIN I (HIGH SENSITIVITY)    EKG None  Radiology DG Chest 2 View  Result Date: 11/01/2022 CLINICAL DATA:  Chest pain.  COVID positive.  Syncope EXAM: CHEST - 2 VIEW COMPARISON:  02/12/2018 x-ray FINDINGS: Hyperinflation. No consolidation, pneumothorax or effusion. No edema. Normal cardiopericardial silhouette. Calcified aorta. Overlapping cardiac leads. Degenerative changes seen along the spine. IMPRESSION: Hyperinflation.  No acute cardiopulmonary disease Electronically Signed   By: Karen Kays M.D.   On: 11/01/2022 15:15   CT Head Wo Contrast  Result Date: 11/01/2022 CLINICAL DATA:  Head trauma, moderate to severe. COVID developed on Sunday. EXAM: CT HEAD WITHOUT CONTRAST CT CERVICAL SPINE WITHOUT CONTRAST TECHNIQUE: Multidetector CT imaging of the head and cervical spine was performed following the standard protocol without intravenous contrast. Multiplanar CT image reconstructions of the cervical spine were also generated. RADIATION DOSE REDUCTION: This exam was performed according to the departmental dose-optimization program which includes automated exposure control, adjustment of the mA and/or kV according to patient size and/or use of iterative reconstruction technique. COMPARISON:  03/07/2022 FINDINGS: CT HEAD FINDINGS Brain: No evidence of acute infarction, hemorrhage, hydrocephalus, extra-axial collection or mass lesion/mass effect. Mild for age cerebral volume loss. Vascular: No hyperdense vessel or unexpected calcification. Skull: Negative for fracture.  Sinuses/Orbits: No visible injury CT CERVICAL SPINE FINDINGS Alignment: Normal. Skull base and vertebrae: No acute fracture. No primary bone lesion or focal pathologic process. Soft tissues and  spinal canal: No prevertebral fluid or swelling. No visible canal hematoma. Disc levels: Degenerative endplate and facet spurring causes multilevel foraminal stenosis, especially on the right at C3-4 and bilaterally at C5-6 and C6-7. Upper chest: Clear apical lungs IMPRESSION: No evidence of acute intracranial or cervical spine injury. Electronically Signed   By: Tiburcio Pea M.D.   On: 11/01/2022 14:08   CT Cervical Spine Wo Contrast  Result Date: 11/01/2022 CLINICAL DATA:  Head trauma, moderate to severe. COVID developed on Sunday. EXAM: CT HEAD WITHOUT CONTRAST CT CERVICAL SPINE WITHOUT CONTRAST TECHNIQUE: Multidetector CT imaging of the head and cervical spine was performed following the standard protocol without intravenous contrast. Multiplanar CT image reconstructions of the cervical spine were also generated. RADIATION DOSE REDUCTION: This exam was performed according to the departmental dose-optimization program which includes automated exposure control, adjustment of the mA and/or kV according to patient size and/or use of iterative reconstruction technique. COMPARISON:  03/07/2022 FINDINGS: CT HEAD FINDINGS Brain: No evidence of acute infarction, hemorrhage, hydrocephalus, extra-axial collection or mass lesion/mass effect. Mild for age cerebral volume loss. Vascular: No hyperdense vessel or unexpected calcification. Skull: Negative for fracture. Sinuses/Orbits: No visible injury CT CERVICAL SPINE FINDINGS Alignment: Normal. Skull base and vertebrae: No acute fracture. No primary bone lesion or focal pathologic process. Soft tissues and spinal canal: No prevertebral fluid or swelling. No visible canal hematoma. Disc levels: Degenerative endplate and facet spurring causes multilevel foraminal stenosis,  especially on the right at C3-4 and bilaterally at C5-6 and C6-7. Upper chest: Clear apical lungs IMPRESSION: No evidence of acute intracranial or cervical spine injury. Electronically Signed   By: Tiburcio Pea M.D.   On: 11/01/2022 14:08    Procedures Procedures   Medications Ordered in ED Medications  sodium chloride 0.9 % bolus 500 mL (0 mLs Intravenous Stopped 11/01/22 1441)  acetaminophen (TYLENOL) tablet 650 mg (650 mg Oral Given 11/01/22 1342)  sodium chloride 0.9 % bolus 500 mL (500 mLs Intravenous New Bag/Given 11/01/22 1532)  HYDROcodone-acetaminophen (NORCO/VICODIN) 5-325 MG per tablet 1 tablet (1 tablet Oral Given 11/01/22 1539)    ED Course/ Medical Decision Making/ A&P  Medical Decision Making Amount and/or Complexity of Data Reviewed Labs: ordered. Radiology: ordered.   77 year old male presents to ED for evaluation.  Please see HPI for further details.  On examination the patient is afebrile, nontachycardic.  Lung sounds are clear bilaterally and he is not hypoxic.  Abdomen is soft and compressible throughout.  Neurological examination is baseline.  He does have centralized cervical spinal tenderness, he arrives in a c-collar.  He is overall nontoxic in appearance.  Patient CBC shows no leukocytosis, no anemia.  Patient metabolic panel shows potassium 3.4, creatinine 1.39, anion gap 15.  Patient creatinine is slightly elevated from his baseline so we will give 1 L of fluid.  Patient does not appear volume overloaded.  Patient troponin initially 10, delta of 9.  His EKG is nonischemic.  His chest x-ray shows no consolidations or effusions.  Patient CT cervical spine unremarkable.  Patient CT head without contrast unremarkable.  Patient given 1 L of fluid, 5 mg hydrocodone for pain.  On reassessment, patient reports feeling better.  At this time, the patient will be discharged.  He will follow-up with his PCP for further management.  Suspected vasovagal syncope as cause of  syncopal event today.  Patient and patient wife at bedside given return precautions and they voiced understanding.  All questions answered to the patient satisfaction.  The  patient is stable to discharge at this time.  Patient case discussed with attending Dr. Maple Hudson who voices agreement with plan of management.   Final Clinical Impression(s) / ED Diagnoses Final diagnoses:  Syncope and collapse  Dehydration  Neck pain  COVID-19    Rx / DC Orders ED Discharge Orders     None         Al Decant, PA-C 11/01/22 1638    Coral Spikes, DO 11/07/22 [phone]

## 2022-11-01 NOTE — ED Notes (Addendum)
The pt is c/o neck pain  pain med given  8/10

## 2022-11-01 NOTE — ED Notes (Signed)
Ambulated patient while monitoring pulse ox and O2 remained at 98.

## 2022-11-01 NOTE — ED Triage Notes (Signed)
Per EMS  Covid +  On Sunday Syncope episode Hit head  Hx in decemeber Dx with vertigo Neck pain Pt reports "it feels like bones popping" Shoulder pain Right side CP left side  18 LAC CBG 187

## 2022-11-01 NOTE — ED Notes (Signed)
Got patient on the monitor did EKG shown to er provider patient is resting with call bell in reach  

## 2022-11-01 NOTE — Discharge Instructions (Addendum)
It was a pleasure taking part in your care today.  As we discussed, your workup is reassuring.  I believe that the cause of you passing out this morning was due to vasovagal syncope.  I would like for you to continue hydrating yourself when you are discharged home.  Please follow-up with your PCP for further management and reevaluation.  Please return to the ED with any new or worsening signs or symptoms.

## 2022-11-02 ENCOUNTER — Telehealth: Payer: Self-pay

## 2022-11-02 NOTE — Transitions of Care (Post Inpatient/ED Visit) (Signed)
11/02/2022  Name: Charles Underwood MRN: [id number] DOB: 1945-09-18  Today's TOC FU Call Status: Today's TOC FU Call Status:: Successful TOC FU Call Completed TOC FU Call Complete Date: 11/02/22  Transition Care Management Follow-up Telephone Call Date of Discharge: 11/01/22 Discharge Facility[facility]) Type of Discharge: Emergency Department Reason for ED Visit: Other: (Syncope and collapse) How have you been since you were released from the hospital?: Better Any questions or concerns?: No  Items Reviewed: Did you receive and understand the discharge instructions provided?: Yes Medications obtained,verified, and reconciled?: Yes (Medications Reviewed) Any new allergies since your discharge?: No Dietary orders reviewed?: Yes Do you have support at home?: Yes  Medications Reviewed Today: Medications Reviewed Today     Reviewed by Merleen Nicely, LPN (Licensed Practical Nurse) on 11/02/22 at 1317  Med List Status: <None>   Medication Order Taking? Sig Documenting Provider Last Dose Status Informant  aspirin 81 MG chewable tablet [id number] Yes CHEW ONE TABLET BY MOUTH DAILY FOR HEART [provider] Taking Active   azelastine (ASTELIN) 0.1 % nasal spray [id number] Yes USE 2 SPRAYS IN INTO NOSE EVERY DAY [provider] Taking Active   carboxymethylcellulose (REFRESH PLUS) 0.5 % SOLN [id number] Yes INSTILL 1 DROP IN BOTH EYES TWICE A DAY [provider] Taking Active   chlorthalidone (HYGROTON) 25 MG tablet [id number] Yes Take 12.5 mg by mouth daily.  [provider] Taking Active Spouse/Significant Other  Cholecalciferol 25 MCG (1000 UT) tablet [id number] Yes Take 1 tablet by mouth daily. [provider] Taking Active   diclofenac Sodium (VOLTAREN) 1 % GEL [id number] Yes APPLY 2 GRAMS TO AFFECTED AREA TWICE A DAY AND AS NEEDED TO PAINFUL JOINT (DO NOT TAKE IBUPROFEN OR ALEVE WHILE USING GEL) [provider] Taking Active    empagliflozin (JARDIANCE) 25 MG TABS tablet [id number] Yes Take 12.5 mg by mouth daily. [provider] Taking Active Spouse/Significant Other  escitalopram (LEXAPRO) 10 MG tablet [id number] Yes Take 10 mg by mouth daily. [provider] Taking Active Spouse/Significant Other  fluticasone (FLONASE) 50 MCG/ACT nasal spray [id number] Yes INSTILL 2 SPRAYS IN [facility] NOSTRIL DAILY [provider] Taking Active   KRILL OIL PO [id number] Yes Take 350 mg by mouth daily. Taking Mega Red [provider] Taking Active Spouse/Significant Other  levothyroxine (SYNTHROID) 100 MCG tablet [id number] Yes Take 100 mcg by mouth daily before breakfast. [provider] Taking Active   losartan (COZAAR) 100 MG tablet [id number] Yes TAKE ONE TABLET BY MOUTH DAILY FOR BLOOD PRESSURE [provider] Taking Active   metFORMIN (GLUCOPHAGE) 1000 MG tablet [id number] Yes Take 1,000 mg by mouth 2 (two) times daily with a meal. [provider] Taking Active   metoprolol tartrate (LOPRESSOR) 100 MG tablet [id number] Yes Take 1 tablet by mouth 2 hours prior to scan Tonny Bollman, MD Taking Active   Multiple Vitamins-Minerals (ICAPS AREDS 2) CAPS [id number] Yes Take 1 capsule by mouth 2 (two) times daily.  [provider] Taking Active Spouse/Significant Other  pantoprazole (PROTONIX) 40 MG tablet [id number] Yes TAKE ONE TABLET BY MOUTH TWICE A DAY BEFORE MEALS (TAKE ON AN EMPTY STOMACH 30 MINUTES PRIOR TO A MEAL) [provider] Taking Active   rosuvastatin (CRESTOR) 20 MG tablet [id number] Yes Take 10 mg by mouth daily.  [provider] Taking Active Spouse/Significant Other  silodosin (RAPAFLO) 8 MG CAPS capsule [id number] Yes Take 1 capsule (8 mg total) by mouth daily with breakfast. Lonna Cobb, Verna Czech, MD  Taking Active   traZODone (DESYREL) 100 MG tablet [id number] Yes Take 150 mg by mouth at bedtime as needed for sleep. [provider] Taking  Active Spouse/Significant Other  vitamin B-12 (CYANOCOBALAMIN) 1000 MCG tablet [id number] Yes Take 1,000 mcg by mouth daily. [provider] Taking Active Spouse/Significant Other  vitamin C (ASCORBIC ACID) 500 MG tablet [id number] Yes Take 500 mg by mouth daily. Patient taking 250 mg [provider] Taking Active Spouse/Significant Other            Home Care and Equipment/Supplies: Were Home Health Services Ordered?: No Any new equipment or medical supplies ordered?: No  Functional Questionnaire: Do you need assistance with bathing/showering or dressing?: No Do you need assistance with meal preparation?: No Do you need assistance with eating?: No Do you have difficulty maintaining continence: No Do you need assistance with getting out of bed/getting out of a chair/moving?: No Do you have difficulty managing or taking your medications?: No  Follow up appointments reviewed: PCP Follow-up appointment confirmed?: No (declined) MD Provider Line Number:[phone] Given: Yes Specialist Hospital Follow-up appointment confirmed?: No Do you need transportation to your follow-up appointment?: No Do you understand care options if your condition(s) worsen?: Yes-patient verbalized understanding    SIGNATURE  Woodfin Ganja LPN [facility] Nurse Health Advisor Direct Dial [phone]

## 2022-11-07 ENCOUNTER — Ambulatory Visit: Payer: Medicare Other | Attending: Family Medicine

## 2022-11-07 ENCOUNTER — Encounter: Payer: Self-pay | Admitting: Family Medicine

## 2022-11-07 ENCOUNTER — Ambulatory Visit (INDEPENDENT_AMBULATORY_CARE_PROVIDER_SITE_OTHER): Payer: Medicare Other | Admitting: Family Medicine

## 2022-11-07 VITALS — BP 120/64 | HR 79 | Temp 97.4°F | Ht 74.0 in | Wt 220.0 lb

## 2022-11-07 DIAGNOSIS — R269 Unspecified abnormalities of gait and mobility: Secondary | ICD-10-CM | POA: Insufficient documentation

## 2022-11-07 DIAGNOSIS — F411 Generalized anxiety disorder: Secondary | ICD-10-CM | POA: Insufficient documentation

## 2022-11-07 DIAGNOSIS — Z7729 Contact with and (suspected ) exposure to other hazardous substances: Secondary | ICD-10-CM | POA: Insufficient documentation

## 2022-11-07 DIAGNOSIS — M542 Cervicalgia: Secondary | ICD-10-CM | POA: Insufficient documentation

## 2022-11-07 DIAGNOSIS — R55 Syncope and collapse: Secondary | ICD-10-CM | POA: Diagnosis not present

## 2022-11-07 DIAGNOSIS — R5383 Other fatigue: Secondary | ICD-10-CM

## 2022-11-07 DIAGNOSIS — U071 COVID-19: Secondary | ICD-10-CM | POA: Insufficient documentation

## 2022-11-07 DIAGNOSIS — H6123 Impacted cerumen, bilateral: Secondary | ICD-10-CM | POA: Insufficient documentation

## 2022-11-07 DIAGNOSIS — L603 Nail dystrophy: Secondary | ICD-10-CM | POA: Insufficient documentation

## 2022-11-07 DIAGNOSIS — Z461 Encounter for fitting and adjustment of hearing aid: Secondary | ICD-10-CM | POA: Insufficient documentation

## 2022-11-07 DIAGNOSIS — R42 Dizziness and giddiness: Secondary | ICD-10-CM | POA: Insufficient documentation

## 2022-11-07 DIAGNOSIS — I2510 Atherosclerotic heart disease of native coronary artery without angina pectoris: Secondary | ICD-10-CM | POA: Insufficient documentation

## 2022-11-07 DIAGNOSIS — H40013 Open angle with borderline findings, low risk, bilateral: Secondary | ICD-10-CM | POA: Insufficient documentation

## 2022-11-07 DIAGNOSIS — E038 Other specified hypothyroidism: Secondary | ICD-10-CM | POA: Insufficient documentation

## 2022-11-07 DIAGNOSIS — H903 Sensorineural hearing loss, bilateral: Secondary | ICD-10-CM | POA: Insufficient documentation
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
## 2022-11-07 NOTE — Progress Notes (Unsigned)
Enrolled patient for a 14 day Zio XT monitor to be mailed to patients home   [place]oper to read 

## 2022-11-08 LAB — CBC WITH DIFFERENTIAL/PLATELET
Absolute Monocytes: 321 {cells}/uL (ref 200–950)
Basophils Absolute: 22 {cells}/uL (ref 0–200)
Basophils Relative: 0.5 %
Eosinophils Absolute: 70 {cells}/uL (ref 15–500)
Eosinophils Relative: 1.6 %
HCT: 43.6 % (ref 38.5–50.0)
Hemoglobin: 15 g/dL (ref 13.2–17.1)
Lymphs Abs: 871 cells/uL (ref 850–3900)
MCH: 28.8 pg (ref 27.0–33.0)
MCHC: 34.4 g/dL (ref 32.0–36.0)
MCV: 83.7 fL (ref 80.0–100.0)
MPV: 11.3 fL (ref 7.5–12.5)
Monocytes Relative: 7.3 %
Neutro Abs: 3115 {cells}/uL (ref 1500–7800)
Neutrophils Relative %: 70.8 %
Platelets: 148 10*3/uL (ref 140–400)
RBC: 5.21 10*6/uL (ref 4.20–5.80)
RDW: 15.7 % — ABNORMAL HIGH (ref 11.0–15.0)
Total Lymphocyte: 19.8 %
WBC: 4.4 10*3/uL (ref 3.8–10.8)

## 2022-11-08 LAB — COMPLETE METABOLIC PANEL WITH GFR
AG Ratio: 2 (calc) (ref 1.0–2.5)
ALT: 34 U/L (ref 9–46)
AST: 26 U/L (ref 10–35)
Albumin: 4.5 g/dL (ref 3.6–5.1)
Alkaline phosphatase (APISO): 76 U/L (ref 35–144)
BUN: 20 mg/dL (ref 7–25)
CO2: 24 mmol/L (ref 20–32)
Calcium: 9 mg/dL (ref 8.6–10.3)
Chloride: 99 mmol/L (ref 98–110)
Creat: 1.15 mg/dL (ref 0.70–1.28)
Globulin: 2.3 g/dL (ref 1.9–3.7)
Glucose, Bld: 156 mg/dL — ABNORMAL HIGH (ref 65–99)
Potassium: 3.5 mmol/L (ref 3.5–5.3)
Sodium: 136 mmol/L (ref 135–146)
Total Bilirubin: 0.6 mg/dL (ref 0.2–1.2)
Total Protein: 6.8 g/dL (ref 6.1–8.1)
eGFR: 65 mL/min/{1.73_m2} (ref >=60)

## 2022-11-08 LAB — HEMOGLOBIN A1C
Hgb A1c MFr Bld: 8.1 %{Hb} — ABNORMAL HIGH (ref <5.7)
Mean Plasma Glucose: 186 mg/dL
eAG (mmol/L): 10.3 mmol/L

## 2022-11-08 LAB — VITAMIN B12: Vitamin B-12: 786 pg/mL (ref 200–1100)

## 2022-11-08 LAB — TSH: TSH: 2.81 m[IU]/L (ref 0.40–4.50)

## 2022-11-10 DIAGNOSIS — R55 Syncope and collapse: Secondary | ICD-10-CM | POA: Diagnosis not present

## 2022-11-21 ENCOUNTER — Ambulatory Visit (INDEPENDENT_AMBULATORY_CARE_PROVIDER_SITE_OTHER): Payer: Medicare Other | Admitting: Family Medicine

## 2022-11-21 ENCOUNTER — Encounter: Payer: Self-pay | Admitting: Family Medicine

## 2022-11-21 ENCOUNTER — Ambulatory Visit: Payer: Medicare Other | Admitting: Family Medicine

## 2022-11-21 VITALS — BP 144/86 | HR 61 | Ht 74.0 in | Wt 225.7 lb

## 2022-11-21 DIAGNOSIS — R55 Syncope and collapse: Secondary | ICD-10-CM

## 2022-11-21 MED ORDER — AMLODIPINE BESYLATE 5 MG PO TABS: 5.0000 mg | ORAL_TABLET | Freq: Every day | ORAL | 3 refills | Status: AC
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
## 2022-12-08 ENCOUNTER — Telehealth: Payer: Self-pay

## 2022-12-08 NOTE — Telephone Encounter (Signed)
Pt's wife called for heart monitor (Zio) results. Copy placed in green folder for you to review. Thank you.

## 2022-12-12 ENCOUNTER — Other Ambulatory Visit: Payer: Self-pay

## 2022-12-12 DIAGNOSIS — I472 Ventricular tachycardia, unspecified: Secondary | ICD-10-CM

## 2022-12-12 DIAGNOSIS — R55 Syncope and collapse: Secondary | ICD-10-CM
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
## 2022-12-14 ENCOUNTER — Ambulatory Visit: Payer: Medicare Other | Admitting: Physician Assistant

## 2022-12-14 ENCOUNTER — Encounter: Payer: Self-pay | Admitting: Physician Assistant

## 2022-12-14 VITALS — BP 118/78 | HR 65 | Ht 74.0 in | Wt 247.0 lb

## 2022-12-14 DIAGNOSIS — E1140 Type 2 diabetes mellitus with diabetic neuropathy, unspecified: Secondary | ICD-10-CM | POA: Diagnosis not present

## 2022-12-14 DIAGNOSIS — K219 Gastro-esophageal reflux disease without esophagitis: Secondary | ICD-10-CM | POA: Diagnosis not present

## 2022-12-14 DIAGNOSIS — K861 Other chronic pancreatitis: Secondary | ICD-10-CM | POA: Diagnosis not present

## 2022-12-14 DIAGNOSIS — R195 Other fecal abnormalities: Secondary | ICD-10-CM

## 2022-12-14 DIAGNOSIS — Z86010 Personal history of colonic polyps: Secondary | ICD-10-CM

## 2022-12-14 MED ORDER — METRONIDAZOLE 250 MG PO TABS: 250.0000 mg | ORAL_TABLET | Freq: Three times a day (TID) | ORAL | 0 refills | Status: AC

## 2022-12-14 NOTE — Patient Instructions (Addendum)
VISIT SUMMARY:  During your visit, we discussed your long-standing diabetes and the recent findings of chronic changes in your pancreas, likely due to your diabetes. We also discussed your ongoing issue with loose stools, which may be related to your diabetes medication, metformin. We found no evidence of cancer in your pancreas, which is a positive outcome. We also discussed your elevated A1c levels, indicating that your diabetes needs better management.  YOUR PLAN:  -CHRONIC PANCREATITIS: This is a condition where your pancreas has become inflamed over a long period. In your case, it's likely due to your long-standing diabetes. Currently, there are no symptoms that require immediate intervention.  -CHRONIC DIARRHEA: You've been experiencing loose stools for over a year. This could be due to your diabetes medication, metformin, or other possible causes. We plan to adjust your metformin dosage, start a trial of Flagyl for possible small intestinal bacterial overgrowth, and provide you with dietary information to manage loose stools. If symptoms persist, we may consider a colonoscopy.  -DIABETES MELLITUS: This is a condition where your body doesn't properly process sugar, leading to high blood sugar levels. Your recent elevated A1c levels indicate that your diabetes needs better management. We plan to discuss possible adjustment of your metformin dosage due to its potential contribution to your chronic diarrhea.  INSTRUCTIONS:  Please follow up with your primary care provider to discuss adjusting your metformin dosage. Start taking Flagyl, three pills daily for 10 days. We will provide you with dietary information to help manage your loose stools. If your symptoms persist after these interventions, please contact us to consider further steps, which may include a colonoscopy.  - Drink a lot of liquids that have water, salt, and sugar. Good choices are water mixed with juice, flavored soda, and soup  broth. If you are drinking enough, your urine will be light yellow or almost clear.  - Try to eat a little food. Good choices are potatoes, noodles, rice, oatmeal, crackers, bananas, soup, and boiled vegetables.  - Avoid high fat foods, as they can make diarrhea worse.  - Dairy products (except yogurt) may be difficult to digest when you have diarrhea. I recommend that you temporarily avoid lactose-containing foods.  - Can try loperamide 4 mg initially, then 2 mg after each unformed stool for =2 days, with a maximum of 16 mg/day.  - If loperamide is not working, you could try bismuth salicylate (Pepto-Bismol) 30 mL or two tablets every 30 minutes for eight doses. Pepto-Bismol may make your stools black.   Go to the ER if any severe abdominal pain, fever, or weakness  We may want to evaluate you for small intestinal bacterial overgrowth, this can cause increase gas, bloating, loose stools or constipation.  There is a test for this we can do or sometimes we will treat a patient with an antibiotic to see if it helps.  TRY THE ANTIBIOTIC, IF YOU GET NAUSEA PLEASE LET us KNOW.    FODMAP stands for fermentable oligo-, di-, mono-saccharides and polyols (1). These are the scientific terms used to classify groups of carbs that are difficult for our body to digest and that are notorious for triggering digestive symptoms like bloating, gas, loose stools and stomach pain.   You can try low FODMAP diet  - start with eliminating just one column at a time that you feel may be a trigger for you. - the table at the very bottom contains foods that are low in FODMAPs   Sometimes trying to eliminate  the FODMAP's from your diet is difficult or tricky, if you are stuggling with trying to do the elimination diet you can try an enzyme.  There is a food enzymes that you sprinkle in or on your food that helps break down the FODMAP. You can read more about the enzyme by going to this site: https://fodzyme.com/

## 2022-12-20 ENCOUNTER — Ambulatory Visit: Payer: Medicare Other | Attending: Cardiovascular Disease | Admitting: Cardiovascular Disease

## 2022-12-20 ENCOUNTER — Encounter: Payer: Self-pay | Admitting: Cardiovascular Disease

## 2022-12-20 VITALS — BP 132/84 | HR 61 | Ht 74.0 in | Wt 228.0 lb

## 2022-12-20 DIAGNOSIS — I10 Essential (primary) hypertension: Secondary | ICD-10-CM

## 2022-12-20 DIAGNOSIS — I7121 Aneurysm of the ascending aorta, without rupture: Secondary | ICD-10-CM

## 2022-12-20 DIAGNOSIS — R55 Syncope and collapse: Secondary | ICD-10-CM

## 2022-12-20 DIAGNOSIS — E782 Mixed hyperlipidemia: Secondary | ICD-10-CM

## 2022-12-20 DIAGNOSIS — I2510 Atherosclerotic heart disease of native coronary artery without angina pectoris: Secondary | ICD-10-CM | POA: Diagnosis not present

## 2022-12-20 NOTE — Patient Instructions (Signed)
Medication Instructions:  Your physician recommends that you continue on your current medications as directed. Please refer to the Current Medication list given to you today.  *If you need a refill on your cardiac medications before your next appointment, please call your pharmacy*  Testing/Procedures: Your physician has requested that you have an echocardiogram. Echocardiography is a painless test that uses sound waves to create images of your heart. It provides your doctor with information about the size and shape of your heart and how well your heart's chambers and valves are working. This procedure takes approximately one hour. There are no restrictions for this procedure. Please do NOT wear cologne, perfume, aftershave, or lotions (deodorant is allowed). Please arrive 15 minutes prior to your appointment time.  Follow-Up: At [facility], you and your health needs are our priority.  As part of our continuing mission to provide you with exceptional heart care, we have created designated Provider Care Teams.  These Care Teams include your primary Cardiologist (physician) and Advanced Practice Providers (APPs -  Physician Assistants and Nurse Practitioners) who all work together to provide you with the care you need, when you need it.  Your next appointment:   6 month(s)  Provider:   Jari Favre, PA-C, Ronie Spies, PA-C, Robin Searing, NP, Eligha Bridegroom, NP, Tereso Newcomer, PA-C, or Perlie Gold, PA-C     Then, Tonny Bollman, MD will plan to see you again in 1 year(s).
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
## 2023-01-11 ENCOUNTER — Ambulatory Visit (HOSPITAL_COMMUNITY): Payer: Medicare Other | Attending: Cardiology

## 2023-01-11 DIAGNOSIS — I7121 Aneurysm of the ascending aorta, without rupture: Secondary | ICD-10-CM | POA: Insufficient documentation

## 2023-01-11 DIAGNOSIS — E782 Mixed hyperlipidemia: Secondary | ICD-10-CM | POA: Insufficient documentation

## 2023-01-11 DIAGNOSIS — I10 Essential (primary) hypertension: Secondary | ICD-10-CM | POA: Insufficient documentation

## 2023-01-11 DIAGNOSIS — I2510 Atherosclerotic heart disease of native coronary artery without angina pectoris: Secondary | ICD-10-CM | POA: Insufficient documentation

## 2023-01-11 LAB — ECHOCARDIOGRAM COMPLETE
Area-P 1/2: 2.75 cm2
P 1/2 time: 630 ms
S' Lateral: 3 cm

## 2023-01-17 ENCOUNTER — Telehealth: Payer: Self-pay

## 2023-01-17 DIAGNOSIS — R931 Abnormal findings on diagnostic imaging of heart and coronary circulation: Secondary | ICD-10-CM

## 2023-01-17 NOTE — Telephone Encounter (Signed)
-----   Message from Tonny Bollman sent at 01/17/2023 10:45 AM EDT ----- LVEF in the 'low normal' range of 54% by 3D volume and 50-55% by visual estimate. No significant valvular disease. Aortic dilatation is known and has been followed by CTA. Plan repeat echo in one year to assess for any change in LVEF otherwise continue current Rx. thx

## 2023-01-17 NOTE — Telephone Encounter (Signed)
Called and spoke with patient and wife who verbalized understanding of ECHO results and need to repeat next year. Order placed at this time.

## 2023-03-08 ENCOUNTER — Ambulatory Visit: Payer: Medicare Other | Admitting: *Deleted

## 2023-03-08 DIAGNOSIS — Z0000 Encounter for general adult medical examination without abnormal findings: Secondary | ICD-10-CM

## 2023-03-08 NOTE — Patient Instructions (Signed)
Charles Underwood , Thank you for taking time to come for your Medicare Wellness Visit. I appreciate your ongoing commitment to your health goals. Please review the following plan we discussed and let me know if I can assist you in the future.   Screening recommendations/referrals: Colonoscopy: up to date Recommended yearly ophthalmology/optometry visit for glaucoma screening and checkup Recommended yearly dental visit for hygiene and checkup  Vaccinations: Influenza vaccine: up to date Pneumococcal vaccine: up to date Tdap vaccine: up to date Shingles vaccine: up to date      Preventive Care 65 Years and Older, Male Preventive care refers to lifestyle choices and visits with your health care provider that can promote health and wellness. What does preventive care include? A yearly physical exam. This is also called an annual well check. Dental exams once or twice a year. Routine eye exams. Ask your health care provider how often you should have your eyes checked. Personal lifestyle choices, including: Daily care of your teeth and gums. Regular physical activity. Eating a healthy diet. Avoiding tobacco and drug use. Limiting alcohol use. Practicing safe sex. Taking low doses of aspirin every day. Taking vitamin and mineral supplements as recommended by your health care provider. What happens during an annual well check? The services and screenings done by your health care provider during your annual well check will depend on your age, overall health, lifestyle risk factors, and family history of disease. Counseling  Your health care provider may ask you questions about your: Alcohol use. Tobacco use. Drug use. Emotional well-being. Home and relationship well-being. Sexual activity. Eating habits. History of falls. Memory and ability to understand (cognition). Work and work Astronomer. Screening  You may have the following tests or measurements: Height, weight, and  BMI. Blood pressure. Lipid and cholesterol levels. These may be checked every 5 years, or more frequently if you are over 86 years old. Skin check. Lung cancer screening. You may have this screening every year starting at age 4 if you have a 30-pack-year history of smoking and currently smoke or have quit within the past 15 years. Fecal occult blood test (FOBT) of the stool. You may have this test every year starting at age 67. Flexible sigmoidoscopy or colonoscopy. You may have a sigmoidoscopy every 5 years or a colonoscopy every 10 years starting at age 93. Prostate cancer screening. Recommendations will vary depending on your family history and other risks. Hepatitis C blood test. Hepatitis B blood test. Sexually transmitted disease (STD) testing. Diabetes screening. This is done by checking your blood sugar (glucose) after you have not eaten for a while (fasting). You may have this done every 1-3 years. Abdominal aortic aneurysm (AAA) screening. You may need this if you are a current or former smoker. Osteoporosis. You may be screened starting at age 54 if you are at high risk. Talk with your health care provider about your test results, treatment options, and if necessary, the need for more tests. Vaccines  Your health care provider may recommend certain vaccines, such as: Influenza vaccine. This is recommended every year. Tetanus, diphtheria, and acellular pertussis (Tdap, Td) vaccine. You may need a Td booster every 10 years. Zoster vaccine. You may need this after age 44. Pneumococcal 13-valent conjugate (PCV13) vaccine. One dose is recommended after age 77. Pneumococcal polysaccharide (PPSV23) vaccine. One dose is recommended after age 74. Talk to your health care provider about which screenings and vaccines you need and how often you need them. This information is not intended  to replace advice given to you by your health care provider. Make sure you discuss any questions you have  with your health care provider. Document Released: 04/02/2015 Document Revised: 11/24/2015 Document Reviewed: 01/05/2015 Elsevier Interactive Patient Education  2017 [employer].  Fall Prevention in the Home Falls can cause injuries. They can happen to people of all ages. There are many things you can do to make your home safe and to help prevent falls. What can I do on the outside of my home? Regularly fix the edges of walkways and driveways and fix any cracks. Remove anything that might make you trip as you walk through a door, such as a raised step or threshold. Trim any bushes or trees on the path to your home. Use bright outdoor lighting. Clear any walking paths of anything that might make someone trip, such as rocks or tools. Regularly check to see if handrails are loose or broken. Make sure that both sides of any steps have handrails. Any raised decks and porches should have guardrails on the edges. Have any leaves, snow, or ice cleared regularly. Use sand or salt on walking paths during winter. Clean up any spills in your garage right away. This includes oil or grease spills. What can I do in the bathroom? Use night lights. Install grab bars by the toilet and in the tub and shower. Do not use towel bars as grab bars. Use non-skid mats or decals in the tub or shower. If you need to sit down in the shower, use a plastic, non-slip stool. Keep the floor dry. Clean up any water that spills on the floor as soon as it happens. Remove soap buildup in the tub or shower regularly. Attach bath mats securely with double-sided non-slip rug tape. Do not have throw rugs and other things on the floor that can make you trip. What can I do in the bedroom? Use night lights. Make sure that you have a light by your bed that is easy to reach. Do not use any sheets or blankets that are too big for your bed. They should not hang down onto the floor. Have a firm chair that has side arms. You can use  this for support while you get dressed. Do not have throw rugs and other things on the floor that can make you trip. What can I do in the kitchen? Clean up any spills right away. Avoid walking on wet floors. Keep items that you use a lot in easy-to-reach places. If you need to reach something above you, use a strong step stool that has a grab bar. Keep electrical cords out of the way. Do not use floor polish or wax that makes floors slippery. If you must use wax, use non-skid floor wax. Do not have throw rugs and other things on the floor that can make you trip. What can I do with my stairs? Do not leave any items on the stairs. Make sure that there are handrails on both sides of the stairs and use them. Fix handrails that are broken or loose. Make sure that handrails are as long as the stairways. Check any carpeting to make sure that it is firmly attached to the stairs. Fix any carpet that is loose or worn. Avoid having throw rugs at the top or bottom of the stairs. If you do have throw rugs, attach them to the floor with carpet tape. Make sure that you have a light switch at the top of the stairs and  the bottom of the stairs. If you do not have them, ask someone to add them for you. What else can I do to help prevent falls? Wear shoes that: Do not have high heels. Have rubber bottoms. Are comfortable and fit you well. Are closed at the toe. Do not wear sandals. If you use a stepladder: Make sure that it is fully opened. Do not climb a closed stepladder. Make sure that both sides of the stepladder are locked into place. Ask someone to hold it for you, if possible. Clearly mark and make sure that you can see: Any grab bars or handrails. First and last steps. Where the edge of each step is. Use tools that help you move around (mobility aids) if they are needed. These include: Canes. Walkers. Scooters. Crutches. Turn on the lights when you go into a dark area. Replace any light bulbs  as soon as they burn out. Set up your furniture so you have a clear path. Avoid moving your furniture around. If any of your floors are uneven, fix them. If there are any pets around you, be aware of where they are. Review your medicines with your doctor. Some medicines can make you feel dizzy. This can increase your chance of falling. Ask your doctor what other things that you can do to help prevent falls. This information is not intended to replace advice given to you by your health care provider. Make sure you discuss any questions you have with your health care provider. Document Released: 12/31/2008 Document Revised: 08/12/2015 Document Reviewed: 04/10/2014 Elsevier Interactive Patient Education  2017 [employer].
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
## 2023-12-18 ENCOUNTER — Telehealth (HOSPITAL_COMMUNITY): Payer: Self-pay | Admitting: Cardiovascular Disease

## 2023-12-18 NOTE — Telephone Encounter (Signed)
 I called patient to schedule the ordered echocardiogram for October 2025. Patient state that he is now following the [place] cardiologist. Order will be removed from the echo WQ. Thank you.

## 2024-03-06 ENCOUNTER — Ambulatory Visit

## 2024-03-06 DIAGNOSIS — Z0000 Encounter for general adult medical examination without abnormal findings: Secondary | ICD-10-CM | POA: Diagnosis not present

## 2024-03-06 NOTE — Patient Instructions (Signed)
 Charles Underwood,  Thank you for taking the time for your Medicare Wellness Visit. I appreciate your continued commitment to your health goals. Please review the care plan we discussed, and feel free to reach out if I can assist you further.  Please note that Annual Wellness Visits do not include a physical exam. Some assessments may be limited, especially if the visit was conducted virtually. If needed, we may recommend an in-person follow-up with your provider.  Ongoing Care Seeing your primary care provider every 3 to 6 months helps us  monitor your health and provide consistent, personalized care.   Referrals If a referral was made during today's visit and you haven't received any updates within two weeks, please contact the referred provider directly to check on the status.  Recommended Screenings:  Health Maintenance  Topic Date Due   Yearly kidney health urinalysis for diabetes  Never done   Complete foot exam   11/26/2022   Eye exam for diabetics  12/29/2022   Hemoglobin A1C  05/10/2023   Yearly kidney function blood test for diabetes  11/07/2023   COVID-19 Vaccine (4 - 2025-26 season) 11/19/2023   Colon Cancer Screening  10/14/2024   Medicare Annual Wellness Visit  03/06/2025   DTaP/Tdap/Td vaccine (5 - Td or Tdap) 07/01/2031   Pneumococcal Vaccine for age over 62  Completed   Flu Shot  Completed   Hepatitis C Screening  Completed   Zoster (Shingles) Vaccine  Completed   Meningitis B Vaccine  Aged Out       03/06/2024   11:14 AM  Advanced Directives  Does Patient Have a Medical Advance Directive? Yes  Type of Advance Directive Healthcare Power of Attorney  Does patient want to make changes to medical advance directive? No - Guardian declined  Copy of Healthcare Power of Attorney in Chart? No - copy requested    Vision: Annual vision screenings are recommended for early detection of glaucoma, cataracts, and diabetic retinopathy. These exams can also reveal signs of chronic  conditions such as diabetes and high blood pressure.  Dental: Annual dental screenings help detect early signs of oral cancer, gum disease, and other conditions linked to overall health, including heart disease and diabetes.  Please see the attached documents for additional preventive care recommendations.    Charles Underwood , Thank you for taking time to come for your Medicare Wellness Visit. I appreciate your ongoing commitment to your health goals. Please review the following plan we discussed and let me know if I can assist you in the future.   Screening recommendations/referrals: Colonoscopy:  Recommended yearly ophthalmology/optometry visit for glaucoma screening and checkup Recommended yearly dental visit for hygiene and checkup  Vaccinations: Influenza vaccine:  Pneumococcal vaccine:  Tdap vaccine:  Shingles vaccine:      Preventive Care 65 Years and Older, Male Preventive care refers to lifestyle choices and visits with your health care provider that can promote health and wellness. What does preventive care include? A yearly physical exam. This is also called an annual well check. Dental exams once or twice a year. Routine eye exams. Ask your health care provider how often you should have your eyes checked. Personal lifestyle choices, including: Daily care of your teeth and gums. Regular physical activity. Eating a healthy diet. Avoiding tobacco and drug use. Limiting alcohol use. Practicing safe sex. Taking low doses of aspirin  every day. Taking vitamin and mineral supplements as recommended by your health care provider. What happens during an annual well check? The services  and screenings done by your health care provider during your annual well check will depend on your age, overall health, lifestyle risk factors, and family history of disease. Counseling  Your health care provider may ask you questions about your: Alcohol use. Tobacco use. Drug use. Emotional  well-being. Home and relationship well-being. Sexual activity. Eating habits. History of falls. Memory and ability to understand (cognition). Work and work astronomer. Screening  You may have the following tests or measurements: Height, weight, and BMI. Blood pressure. Lipid and cholesterol levels. These may be checked every 5 years, or more frequently if you are over 69 years old. Skin check. Lung cancer screening. You may have this screening every year starting at age 54 if you have a 30-pack-year history of smoking and currently smoke or have quit within the past 15 years. Fecal occult blood test (FOBT) of the stool. You may have this test every year starting at age 67. Flexible sigmoidoscopy or colonoscopy. You may have a sigmoidoscopy every 5 years or a colonoscopy every 10 years starting at age 31. Prostate cancer screening. Recommendations will vary depending on your family history and other risks. Hepatitis C blood test. Hepatitis B blood test. Sexually transmitted disease (STD) testing. Diabetes screening. This is done by checking your blood sugar (glucose) after you have not eaten for a while (fasting). You may have this done every 1-3 years. Abdominal aortic aneurysm (AAA) screening. You may need this if you are a current or former smoker. Osteoporosis. You may be screened starting at age 20 if you are at high risk. Talk with your health care provider about your test results, treatment options, and if necessary, the need for more tests. Vaccines  Your health care provider may recommend certain vaccines, such as: Influenza vaccine. This is recommended every year. Tetanus, diphtheria, and acellular pertussis (Tdap, Td) vaccine. You may need a Td booster every 10 years. Zoster vaccine. You may need this after age 70. Pneumococcal 13-valent conjugate (PCV13) vaccine. One dose is recommended after age 72. Pneumococcal polysaccharide (PPSV23) vaccine. One dose is recommended after  age 57. Talk to your health care provider about which screenings and vaccines you need and how often you need them. This information is not intended to replace advice given to you by your health care provider. Make sure you discuss any questions you have with your health care provider. Document Released: 04/02/2015 Document Revised: 11/24/2015 Document Reviewed: 01/05/2015 Elsevier Interactive Patient Education  2017 [employer].  Fall Prevention in the Home Falls can cause injuries. They can happen to people of all ages. There are many things you can do to make your home safe and to help prevent falls. What can I do on the outside of my home? Regularly fix the edges of walkways and driveways and fix any cracks. Remove anything that might make you trip as you walk through a door, such as a raised step or threshold. Trim any bushes or trees on the path to your home. Use bright outdoor lighting. Clear any walking paths of anything that might make someone trip, such as rocks or tools. Regularly check to see if handrails are loose or broken. Make sure that both sides of any steps have handrails. Any raised decks and porches should have guardrails on the edges. Have any leaves, snow, or ice cleared regularly. Use sand or salt on walking paths during winter. Clean up any spills in your garage right away. This includes oil or grease spills. What can I do in  the bathroom? Use night lights. Install grab bars by the toilet and in the tub and shower. Do not use towel bars as grab bars. Use non-skid mats or decals in the tub or shower. If you need to sit down in the shower, use a plastic, non-slip stool. Keep the floor dry. Clean up any water that spills on the floor as soon as it happens. Remove soap buildup in the tub or shower regularly. Attach bath mats securely with double-sided non-slip rug tape. Do not have throw rugs and other things on the floor that can make you trip. What can I do in the  bedroom? Use night lights. Make sure that you have a light by your bed that is easy to reach. Do not use any sheets or blankets that are too big for your bed. They should not hang down onto the floor. Have a firm chair that has side arms. You can use this for support while you get dressed. Do not have throw rugs and other things on the floor that can make you trip. What can I do in the kitchen? Clean up any spills right away. Avoid walking on wet floors. Keep items that you use a lot in easy-to-reach places. If you need to reach something above you, use a strong step stool that has a grab bar. Keep electrical cords out of the way. Do not use floor polish or wax that makes floors slippery. If you must use wax, use non-skid floor wax. Do not have throw rugs and other things on the floor that can make you trip. What can I do with my stairs? Do not leave any items on the stairs. Make sure that there are handrails on both sides of the stairs and use them. Fix handrails that are broken or loose. Make sure that handrails are as long as the stairways. Check any carpeting to make sure that it is firmly attached to the stairs. Fix any carpet that is loose or worn. Avoid having throw rugs at the top or bottom of the stairs. If you do have throw rugs, attach them to the floor with carpet tape. Make sure that you have a light switch at the top of the stairs and the bottom of the stairs. If you do not have them, ask someone to add them for you. What else can I do to help prevent falls? Wear shoes that: Do not have high heels. Have rubber bottoms. Are comfortable and fit you well. Are closed at the toe. Do not wear sandals. If you use a stepladder: Make sure that it is fully opened. Do not climb a closed stepladder. Make sure that both sides of the stepladder are locked into place. Ask someone to hold it for you, if possible. Clearly mark and make sure that you can see: Any grab bars or  handrails. First and last steps. Where the edge of each step is. Use tools that help you move around (mobility aids) if they are needed. These include: Canes. Walkers. Scooters. Crutches. Turn on the lights when you go into a dark area. Replace any light bulbs as soon as they burn out. Set up your furniture so you have a clear path. Avoid moving your furniture around. If any of your floors are uneven, fix them. If there are any pets around you, be aware of where they are. Review your medicines with your doctor. Some medicines can make you feel dizzy. This can increase your chance of falling. Ask your doctor  what other things that you can do to help prevent falls. This information is not intended to replace advice given to you by your health care provider. Make sure you discuss any questions you have with your health care provider. Document Released: 12/31/2008 Document Revised: 08/12/2015 Document Reviewed: 04/10/2014 Elsevier Interactive Patient Education  2017 [employer].
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
